# Patient Record
Sex: Female | Born: 1945 | Race: White | Hispanic: No | Marital: Married | Smoking: Never smoker
Health system: Southern US, Community
[De-identification: ages and names within clinical notes are randomized; demographics above are authoritative.]

## PROBLEM LIST (undated history)

## (undated) DIAGNOSIS — R7301 Impaired fasting glucose: Secondary | ICD-10-CM

## (undated) DIAGNOSIS — K649 Unspecified hemorrhoids: Secondary | ICD-10-CM

## (undated) DIAGNOSIS — R002 Palpitations: Secondary | ICD-10-CM

## (undated) DIAGNOSIS — R319 Hematuria, unspecified: Secondary | ICD-10-CM

## (undated) DIAGNOSIS — E119 Type 2 diabetes mellitus without complications: Secondary | ICD-10-CM

## (undated) DIAGNOSIS — E669 Obesity, unspecified: Secondary | ICD-10-CM

## (undated) DIAGNOSIS — E042 Nontoxic multinodular goiter: Secondary | ICD-10-CM

## (undated) DIAGNOSIS — E782 Mixed hyperlipidemia: Secondary | ICD-10-CM

## (undated) DIAGNOSIS — K219 Gastro-esophageal reflux disease without esophagitis: Secondary | ICD-10-CM

## (undated) DIAGNOSIS — F419 Anxiety disorder, unspecified: Secondary | ICD-10-CM

## (undated) DIAGNOSIS — M8580 Other specified disorders of bone density and structure, unspecified site: Secondary | ICD-10-CM

## (undated) DIAGNOSIS — I10 Essential (primary) hypertension: Secondary | ICD-10-CM

## (undated) DIAGNOSIS — E559 Vitamin D deficiency, unspecified: Secondary | ICD-10-CM

## (undated) HISTORY — DX: Nontoxic multinodular goiter: E04.2

## (undated) HISTORY — DX: Vitamin D deficiency, unspecified: E55.9

## (undated) HISTORY — DX: Hematuria, unspecified: R31.9

## (undated) HISTORY — DX: Impaired fasting glucose: R73.01

## (undated) HISTORY — DX: Obesity, unspecified: E66.9

## (undated) HISTORY — DX: Other specified disorders of bone density and structure, unspecified site: M85.80

## (undated) HISTORY — DX: Gastro-esophageal reflux disease without esophagitis: K21.9

## (undated) HISTORY — DX: Essential (primary) hypertension: I10

## (undated) HISTORY — DX: Type 2 diabetes mellitus without complications: E11.9

## (undated) HISTORY — DX: Palpitations: R00.2

## (undated) HISTORY — DX: Mixed hyperlipidemia: E78.2

## (undated) HISTORY — DX: Anxiety disorder, unspecified: F41.9

## (undated) HISTORY — DX: Unspecified hemorrhoids: K64.9

---

## 1999-05-29 ENCOUNTER — Encounter: Admission: RE | Admit: 1999-05-29 | Discharge: 1999-05-29 | Payer: Self-pay | Admitting: Family Medicine

## 1999-05-29 ENCOUNTER — Encounter: Payer: Self-pay | Admitting: Family Medicine

## 2000-05-29 ENCOUNTER — Encounter: Payer: Self-pay | Admitting: Family Medicine

## 2000-05-29 ENCOUNTER — Encounter: Admission: RE | Admit: 2000-05-29 | Discharge: 2000-05-29 | Payer: Self-pay | Admitting: Family Medicine

## 2001-02-11 ENCOUNTER — Encounter: Payer: Self-pay | Admitting: Emergency Medicine

## 2001-02-11 ENCOUNTER — Inpatient Hospital Stay (HOSPITAL_COMMUNITY): Admission: EM | Admit: 2001-02-11 | Discharge: 2001-02-11 | Payer: Self-pay | Admitting: Emergency Medicine

## 2001-05-31 ENCOUNTER — Encounter: Payer: Self-pay | Admitting: *Deleted

## 2001-05-31 ENCOUNTER — Encounter: Admission: RE | Admit: 2001-05-31 | Discharge: 2001-05-31 | Payer: Self-pay | Admitting: *Deleted

## 2001-12-17 ENCOUNTER — Ambulatory Visit (HOSPITAL_COMMUNITY): Admission: RE | Admit: 2001-12-17 | Discharge: 2001-12-17 | Payer: Self-pay | Admitting: *Deleted

## 2002-06-08 ENCOUNTER — Encounter: Admission: RE | Admit: 2002-06-08 | Discharge: 2002-09-06 | Payer: Self-pay | Admitting: Family Medicine

## 2002-07-05 ENCOUNTER — Encounter: Payer: Self-pay | Admitting: Family Medicine

## 2002-07-05 ENCOUNTER — Encounter: Admission: RE | Admit: 2002-07-05 | Discharge: 2002-07-05 | Payer: Self-pay | Admitting: Family Medicine

## 2003-07-20 ENCOUNTER — Encounter: Admission: RE | Admit: 2003-07-20 | Discharge: 2003-09-05 | Payer: Self-pay | Admitting: Family Medicine

## 2003-10-23 ENCOUNTER — Ambulatory Visit (HOSPITAL_COMMUNITY): Admission: RE | Admit: 2003-10-23 | Discharge: 2003-10-23 | Payer: Self-pay | Admitting: Family Medicine

## 2003-10-31 ENCOUNTER — Other Ambulatory Visit: Admission: RE | Admit: 2003-10-31 | Discharge: 2003-10-31 | Payer: Self-pay | Admitting: *Deleted

## 2003-11-13 ENCOUNTER — Encounter: Admission: RE | Admit: 2003-11-13 | Discharge: 2003-11-13 | Payer: Self-pay | Admitting: Family Medicine

## 2003-12-28 ENCOUNTER — Ambulatory Visit (HOSPITAL_COMMUNITY): Admission: RE | Admit: 2003-12-28 | Discharge: 2003-12-28 | Payer: Self-pay | Admitting: General Surgery

## 2003-12-28 ENCOUNTER — Encounter (INDEPENDENT_AMBULATORY_CARE_PROVIDER_SITE_OTHER): Payer: Self-pay | Admitting: Specialist

## 2004-07-08 ENCOUNTER — Ambulatory Visit (HOSPITAL_COMMUNITY): Admission: RE | Admit: 2004-07-08 | Discharge: 2004-07-08 | Payer: Self-pay | Admitting: General Surgery

## 2004-12-30 ENCOUNTER — Ambulatory Visit (HOSPITAL_COMMUNITY): Admission: RE | Admit: 2004-12-30 | Discharge: 2004-12-30 | Payer: Self-pay | Admitting: Family Medicine

## 2005-11-24 ENCOUNTER — Encounter: Admission: RE | Admit: 2005-11-24 | Discharge: 2005-11-24 | Payer: Self-pay | Admitting: Family Medicine

## 2006-02-11 ENCOUNTER — Other Ambulatory Visit: Admission: RE | Admit: 2006-02-11 | Discharge: 2006-02-11 | Payer: Self-pay | Admitting: Surgery

## 2006-03-11 ENCOUNTER — Ambulatory Visit (HOSPITAL_COMMUNITY): Admission: RE | Admit: 2006-03-11 | Discharge: 2006-03-11 | Payer: Self-pay | Admitting: Family Medicine

## 2006-12-30 ENCOUNTER — Other Ambulatory Visit: Admission: RE | Admit: 2006-12-30 | Discharge: 2006-12-30 | Payer: Self-pay | Admitting: Family Medicine

## 2007-05-17 ENCOUNTER — Encounter: Admission: RE | Admit: 2007-05-17 | Discharge: 2007-05-17 | Payer: Self-pay | Admitting: General Surgery

## 2007-05-25 ENCOUNTER — Ambulatory Visit (HOSPITAL_COMMUNITY): Admission: RE | Admit: 2007-05-25 | Discharge: 2007-05-25 | Payer: Self-pay | Admitting: *Deleted

## 2008-05-22 ENCOUNTER — Encounter: Admission: RE | Admit: 2008-05-22 | Discharge: 2008-05-22 | Payer: Self-pay | Admitting: General Surgery

## 2008-05-26 ENCOUNTER — Ambulatory Visit (HOSPITAL_COMMUNITY): Admission: RE | Admit: 2008-05-26 | Discharge: 2008-05-26 | Payer: Self-pay | Admitting: Family Medicine

## 2008-09-19 ENCOUNTER — Ambulatory Visit (HOSPITAL_BASED_OUTPATIENT_CLINIC_OR_DEPARTMENT_OTHER): Admission: RE | Admit: 2008-09-19 | Discharge: 2008-09-19 | Payer: Self-pay | Admitting: Orthopedic Surgery

## 2009-07-31 ENCOUNTER — Ambulatory Visit (HOSPITAL_BASED_OUTPATIENT_CLINIC_OR_DEPARTMENT_OTHER): Admission: RE | Admit: 2009-07-31 | Discharge: 2009-07-31 | Payer: Self-pay | Admitting: Surgery

## 2010-04-16 IMAGING — MG MM DIGITAL SCREENING BILAT
4 series · 4 of 4 positions shown · non-contrast
Comparison: none

DG SCREEN MAMMOGRAM BILATERAL
Bilateral CC and MLO view(s) were taken.

DIGITAL SCREENING MAMMOGRAM WITH CAD:
The breast tissue is almost entirely fatty.  No masses or malignant type calcifications are 
identified.  Compared with prior studies.

[R CC]
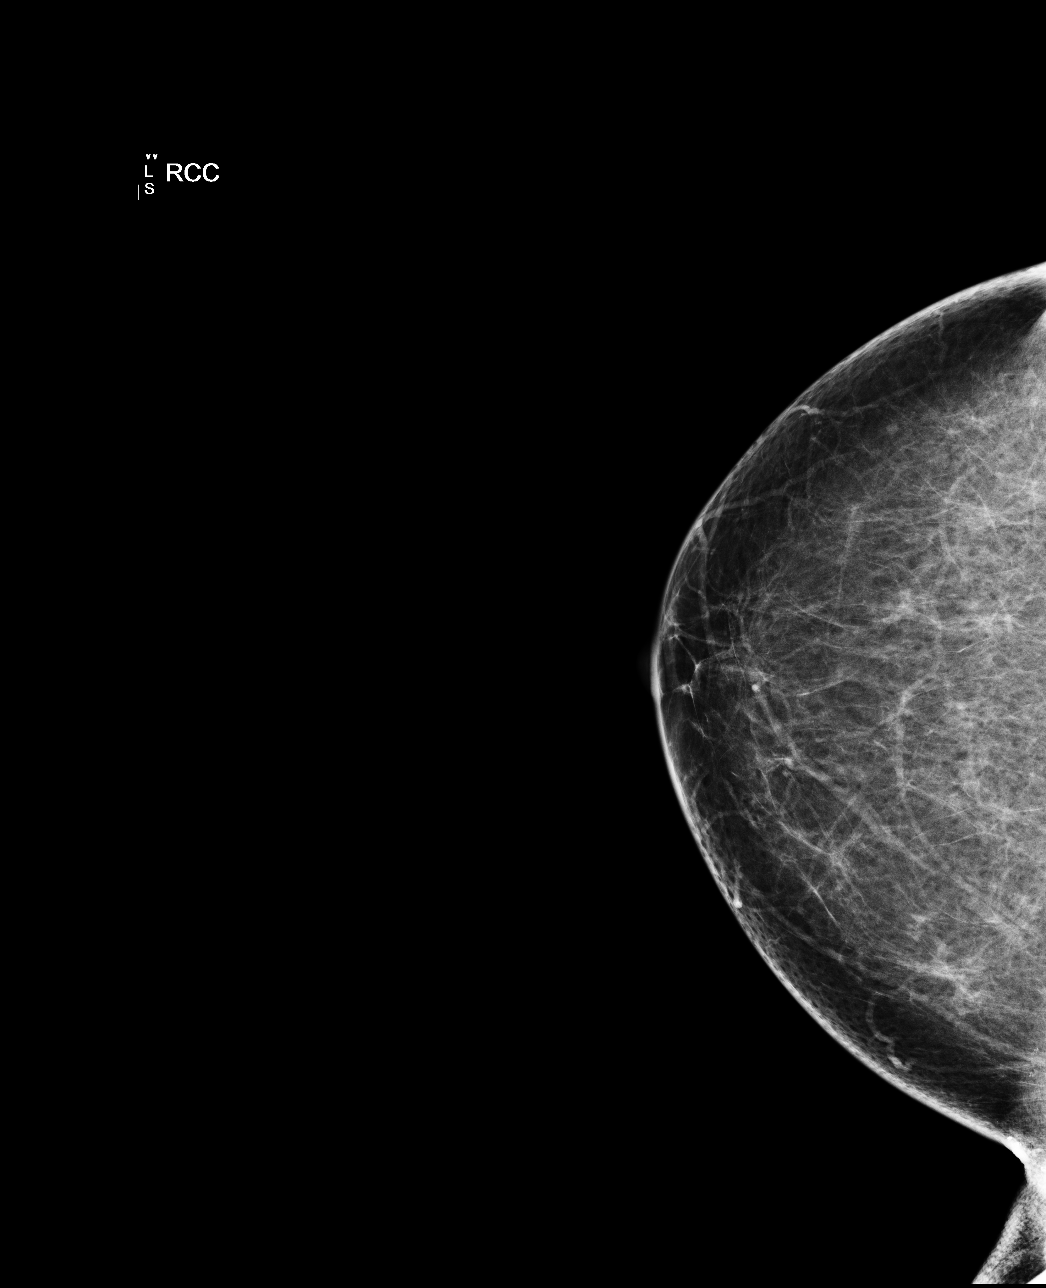

[R MLO]
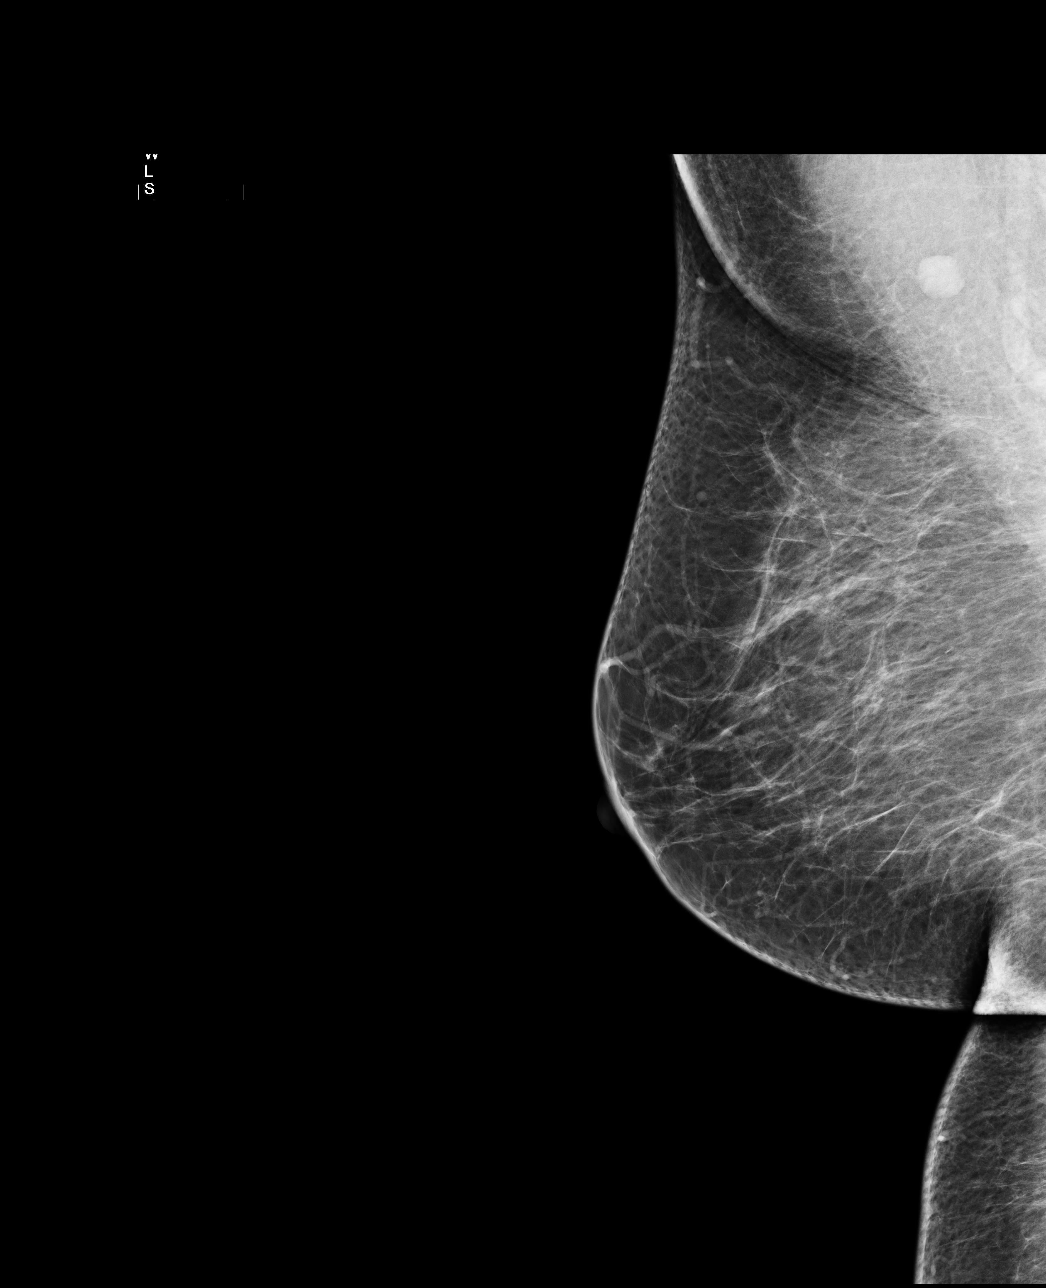

[L CC]
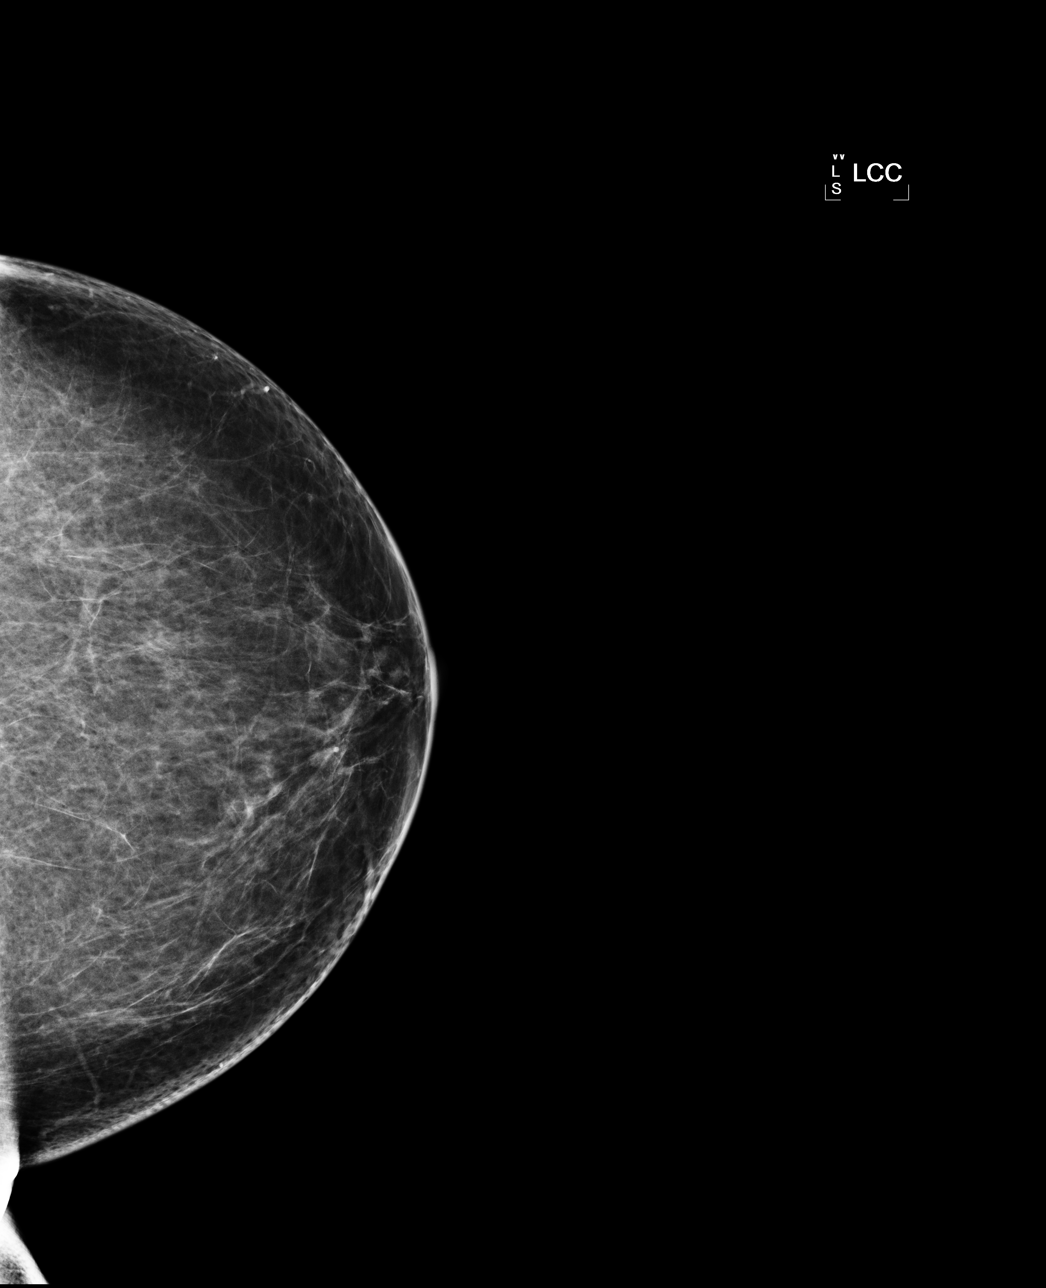

[L MLO]
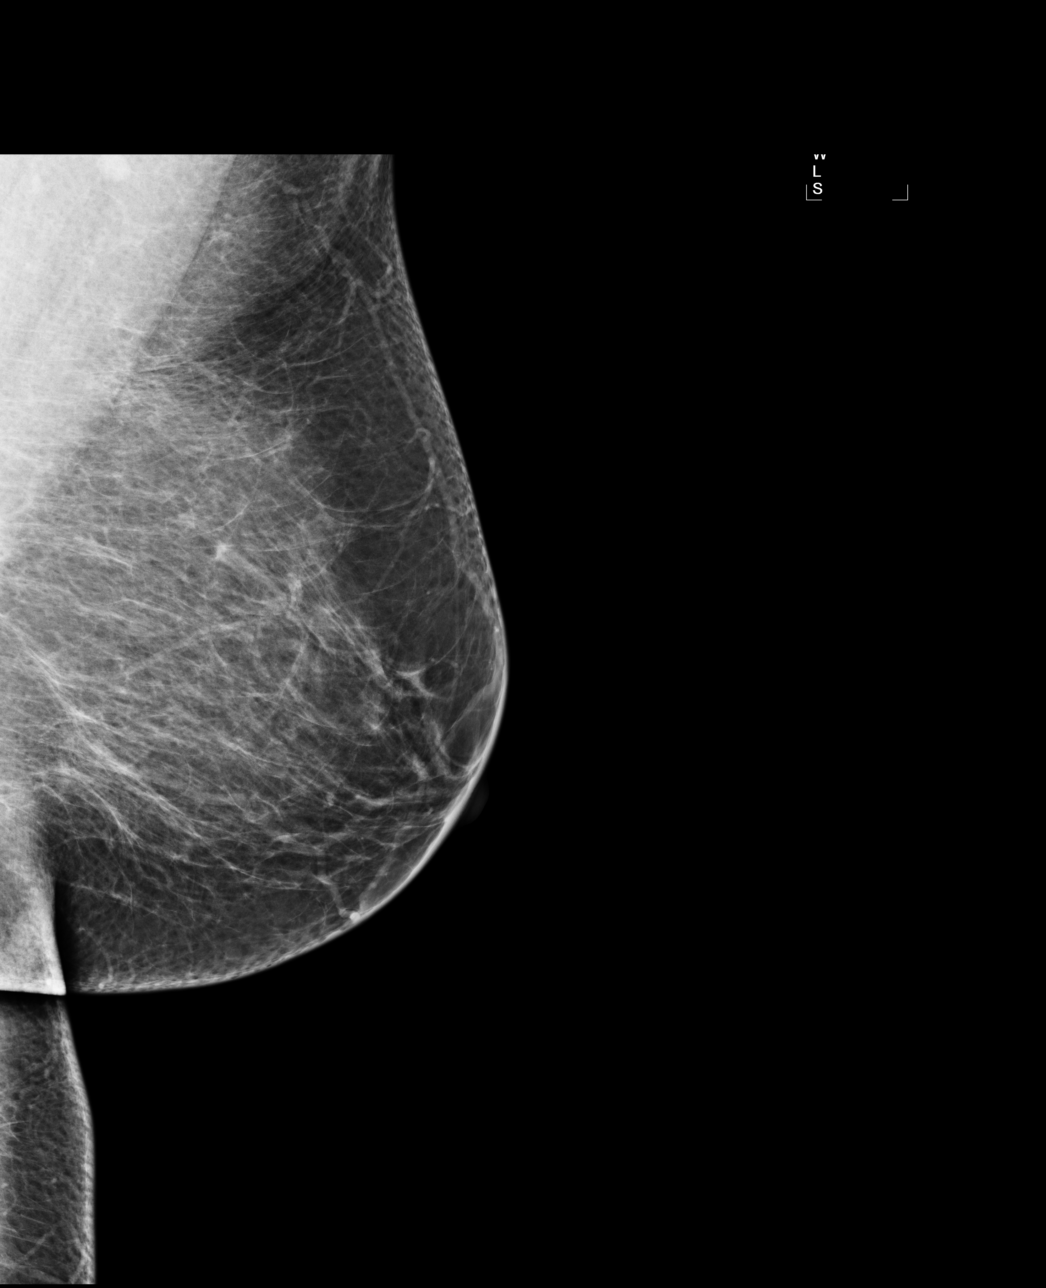

[4 of 4 positions shown; findings below may reference images not displayed]

IMPRESSION: No specific mammographic evidence of malignancy.  Next screening mammogram is recommended in one 
year.

A result letter of this screening mammogram will be mailed directly to the patient.

ASSESSMENT: Negative - BI-RADS 1

Screening mammogram in 1 year.
ANALYZED BY COMPUTER AIDED DETECTION. , THIS PROCEDURE WAS A DIGITAL MAMMOGRAM.

## 2010-05-04 LAB — BASIC METABOLIC PANEL
CO2: 29 mEq/L (ref 19–32)
Calcium: 9.1 mg/dL (ref 8.4–10.5)
GFR calc Af Amer: >60 mL/min (ref >60)
GFR calc non Af Amer: >60 mL/min (ref >60)
Glucose, Bld: 95 mg/dL (ref 70–99)
Potassium: 4.1 mEq/L (ref 3.5–5.1)
Sodium: 138 mEq/L (ref 135–145)

## 2010-05-04 LAB — POCT HEMOGLOBIN-HEMACUE: Hemoglobin: 13.2 g/dL (ref 12.0–15.0)

## 2010-05-04 LAB — GLUCOSE, CAPILLARY: Glucose-Capillary: 138 mg/dL — ABNORMAL HIGH (ref 70–99)

## 2010-06-11 NOTE — Op Note (Signed)
[name], [name]                ACCOUNT NO.:  [id number]   MEDICAL RECORD NO.:  [id number]          PATIENT TYPE:  AMB   LOCATION:  DSC                          FACILITY:  [facility]   PHYSICIAN:  Cindee Salt, M.D.       DATE OF BIRTH:  [birth year]   DATE OF PROCEDURE:  09/19/2008  DATE OF DISCHARGE:                               OPERATIVE REPORT   PREOPERATIVE DIAGNOSIS:  Distal radius fracture, left wrist.   POSTOPERATIVE DIAGNOSIS:  Distal radius fracture, left wrist.   OPERATION:  Open reduction and internal fixation with allograft, left  distal radius, DVR plate, small left.   SURGEON:  Cindee Salt, MD   ASSISTANT:  Dr. Rodman Pickle.   ANESTHESIA:  General.   ANESTHESIOLOGIST:  Bedelia Person, MD   HISTORY:  The patient is a 65 year old female who suffered a fall with a  fracture of her distal radius just beneath the articular surface.  She  is admitted for possible volar plate fixation, possible bridge graft  with bone graft, left distal radius.  She is aware of risks and  complications including infection, recurrence, injury to arteries,  nerves, tendons, complete relief of symptoms, dystrophy, nonunion.  Preoperative area of the patient is seen.  The extremity marked by both  the patient and surgeon.  Antibiotic given.   PROCEDURE:  The patient was brought to the operating room where a  general anesthetic was carried out without difficulty.  She was prepped  using ChloraPrep, supine position, left arm free.  Time-out taken  confirming the patient and procedure.  A 3-minute dry time allowed.  She  was then draped.  A manipulation was performed of the fracture.  This  allowed some reduction but not complete.  It was decided to proceed with  a volar plating and bone grafting.  Attempted fixation directly beneath  the articular subchondral surface.  The limb was exsanguinated with an  Esmarch bandage.  Tourniquet placed high and the arm was inflated to 250  mmHg.  A volar incision  was made to be extended, carried down through  subcutaneous tissue.  The radial artery was identified and protected.  The interval between the radial artery flexor carpi radialis was opened.  Dissection carried down to the flexor pollicis longus pronator  quadratus.  Pronator quadratus was incised in its radial aspect,  elevated, the brachial radialis was released.  The first dorsal  compartment partially released.  This allowed access with opening of the  fracture site.  This was found be in the watershed area of the distal  radius.  The dorsal cortex was then isolated.  The keeled periosteum was  partially incised.  This allowed further reduction.  A Freer elevator  was passed into the fracture.  This was then used to elevate.  X-rays  confirmed repositioning of the fracture.  A 5 mL bone graft was then  placed.  This was left as thick tubes maintaining the reduction.  This  was confirmed with OEC image intensification both AP and lateral  direction.  A DVR plate was then selected.  This  was a narrow short  plate.  This was placed, pinned in position with 4 or 5 K-wires  including two pins going down the ulnar, two pins to be certain that  they did not enter the joint where future screws or pegs would be  placed.  This confirmed positioning of the plate.  The guide sliding  hole was then placed.  I drilled a 12-mm screw placed firmly fixing the  plate.  The first screw was then placed.  This was found to lie  subchondrally on the ulnar surface not entering the joint.  This  measured 20 mm.  This was a nonlocking threaded screw.  The remaining  screws and pegs were placed.  These were placed as pegs on the ulnar  side and screws into the radial styloid.  These measured between 18 and  22 mm.  X-rays were taken revealing that the most ulnar distal screw may  have penetrated into the joint although was felt with the drilling and  measuring for the depth gauge that it was entirely within  bone.  X-rays  were taken multiple directions revealing that it was unable to be  ascertained that it was not partially within the joint.  As such, this  peg was removed, the remaining proximal screws were placed.  These each  measured 10 or 12 mm.  This firmly fixed the fracture and plate.  She  had full pronation, supination, flexion/extension, radial and ulnar  deviation of the wrist.  Further bone graft was placed into the fracture  site.  Wound was copiously irrigated with saline.  The pronator  quadratus was repaired over the plate with figure-of-eight 4-0 Vicryl  sutures, the subcutaneous tissue with interrupted 4-0 Vicryl, and the  skin with interrupted 4-0 Vicryl Rapide.  A sterile compressive dressing  and dorsal palmar splint were applied.  On deflation of the tourniquet,  all fingers immediately pinked.  Bleeders were electrocauterized  throughout the procedure with bipolar.  The patient tolerated the  procedure well and was taken to the recovery room for observation in  satisfactory condition.  She will be discharged home to return to the  hand center in [place] in 1 week on Percocet.           ______________________________  Cindee Salt, M.D.     GK/MEDQ  D:  09/19/2008  T:  09/20/2008  Job:  [id number]   cc:   Lunette Stands, M.D.

## 2010-06-14 NOTE — Consult Note (Signed)
[place]. [facility]  Patient:    [name], [name] Visit Number: [id number] MRN: [id number]          Service Type: MED Location: [phone] Attending Physician:  Miguel Aschoff Dictated by:   Darci Needle, M.D. Proc. Date: 02/11/01 Admit Date:  02/11/2001 Discharge Date: 02/11/2001   CC:         Francis Dowse H. Idell Pickles, M.D., primary care physician  Rosanne Sack, M.D.   Consultation Report  CONCLUSIONS: 1. Prolonged chest pressure consistent with ischemic quality chest pain.    Nonspecific T wave abnormality involving leads I, aVL, V1 and V2. 2. Syncope during the patients prolonged chest pain of uncertain etiology.    Rule out vagal, rule out ischemic arrhythmia, rule out orthostasis. 3. Hypercholesterolemia on Lipitor.  RECOMMENDATION: 1. Serial enzymes and monitoring to rule out myocardial injury and arrhythmia. 2. Coronary angiography to exclude coronary artery disease. 3. If coronary angiography negative, consider carotid Doppler and    echocardiogram. 4. Continue aspirin and statin. 5. Hold antithrombotic therapy pending catheterization later this morning.  COMMENTS:  The patient is 65 years of age and has been in good health.  She has started to exercise over the past two weeks.  She has felt fine while walking on her treadmill.  When she went to bed last night she felt a little bit tired and weak but had no specific complaints.  She awakened at approximately 12:15 a.m. with substernal pressure in her chest.  After several minutes she got up to go to the bathroom, and after having gone to the bathroom and urinated, on her way back she collapsed.  Her husband heard her fall and found her on the floor, where she was pale and diaphoretic.  He dragged her back to the bed and at that point she began to come around.  There was never any mention of absent respiratory effort.  Initially she was somewhat confused but this cleared up  after 30 to 60 seconds.  She continued to have mild to moderate substernal tightness that lasted 35 to 40 minutes after the episode.  She estimates that the total duration of chest discomfort was 45 minutes.  In the emergency room she had total resolution of the discomfort by the time of evaluation and has had no subsequent discomfort. There was no radiation of the discomfort or shortness of breath.  There is one prior history of an episode of syncope over 10 years ago when she received bad news concerning her fathers health.  FAMILY HISTORY:  Coronary artery disease.  Her father had an MI at age 43.  SOCIAL HISTORY:  She does not smoke.  MEDICAL HISTORY:  She has a history of hypercholesterolemia and is on Lipitor. She is not diabetic.  There is no history of treated hypertension.  SIGNIFICANT MEDICAL PROBLEMS: 1. Hyperlipidemia. 2. Gastroesophageal reflux disease. 3. Sinusitis. 4. History of diet-controlled diabetes.  PHYSICAL EXAMINATION:  GENERAL:  The patient is in no acute distress.  VITAL SIGNS:  Blood pressure 142/70, heart rate 70.  SKIN:  Warm and dry.  HEENT:  Unremarkable, no xanthelasma.  NECK:  No carotid bruits, no JVD.  LUNGS:  Clear to auscultation and percussion.  CARDIAC:  Unremarkable.  No murmur, click, rub, or gallop is heard.  PMI is nonpalpable.  ABDOMEN:  Soft.  Liver and spleen are not palpable.  EXTREMITIES:  No edema.  NEUROLOGIC:  Patient is intact without focal cranial nerve or motor  deficits.  LABORATORY DATA:  EKG initially demonstrated T wave inversion in I, aVL, V1, and V2.  Subsequent EKG revealed less T wave abnormality 30 minutes later.  Initial CK enzymes and other laboratory work were unremarkable. Dictated by:   Darci Needle, M.D. Attending Physician:  Miguel Aschoff DD:  02/11/01 TD:  02/12/01 Job: [id number]

## 2010-06-14 NOTE — H&P (Signed)
[place]. [facility]  Patient:    Hannah, Wiley Visit Number: [id number] MRN: [id number]          Service Type: Attending:  Rosanne Sack, M.D. Dictated by:   Rosanne Sack, M.D. Adm. Date:  02/11/01   CC:         [name], M.D., [facility]  Darci Needle, M.D., [facility]   History and Physical  DATE OF BIRTH:  12-08-1945  PROBLEM LIST:  1. Chest pain, rule out myocardial infarction.  2. Syncope.  3. Abnormal electrocardiogram (slight ST elevation in leads III and aVF,     Q wave in lead III, T wave inversion in leads I, aVL, V1, V2, and early     R wave progression in precordial leads).  4. Hypertension, diet controlled.  5. Hyperlipidemia.  6. Prerenal azotemia, BUN 10, creatinine 1.0.  7. Gastroesophageal reflux disease.  8. Postmenopausal, quit hormonal replacement therapy in the recent past after     recent studies in the media.  9. Recurrent sinusitis and sinus congestion associated with occasional     headaches. 10. Status post hysterectomy with a single oophorectomy in 1974.  CHIEF COMPLAINT:  Chest pain.  HISTORY OF PRESENT ILLNESS:  [name] is a very pleasant 65 year old female who presents with complaints of substernal chest tightness since midnight today.  The patient felt not right last evening.  She walks on her treadmill daily.  After walking on the treadmill last evening, she felt weak and "feeling not right."  She woke up this morning with substernal chest tightness associated with nausea.  No vomiting.  No radiation.  No diaphoresis.  This chest tightness did not get any better sitting up.  She describes this chest tightness as a completely different feeling than her occasional GERD symptoms. She went to the bathroom.  On her way back from the bathroom and while standing up, the patient felt light-headed and nauseated.  She passed out for several minutes.  There  was no evidence of seizure activity.  Her husband was present in the house.  No bowel or urinary incontinence.  No chest pain.  No lower extremity swelling.  No recent long trips.  No recent trauma.  No surgery.  No recent fever, chills, nausea, vomiting, diarrhea or constipation. The patient felt "bad" and still light-headedness with chest pressure when she woke up.  Initially, she could not talk clearly for a few seconds, though afterwards, she was able to talk normally.  No focal weakness.  No palpitations prior to the syncopal episode.  The patient denies new medications.  No previous cardiac problems.  When the EMS arrived, the systolic blood pressure dropped from 137 to 120 from sitting up to standing up.  No swallowing problems.  No amaurosis fugax.  No focal weakness.  The patient had been noticing some increased sinus congestion for which she took her usual over-the-counter antihistaminic.  Occasional GERD symptoms.  No urinary symptoms.  No melena, tarry stools or bright red blood per rectum.  No weight loss.  PAST MEDICAL HISTORY:  As HPI.  ALLERGIES:  None.  MEDICATIONS:  1. Lipitor 20 mg p.o. q.d.  2. Over-the-counter H2 blocker p.r.n.  3. Sine-Off p.r.n.  4. Calcium with vitamin D.  SOCIAL HISTORY:  The patient is married and has three grown children.  She never smoked or drank.  She works in [employer].  FAMILY MEDICAL HISTORY:  The patients father  had an acute MI at age of 70. No diabetes in the family nor strokes or malignancies.  Her mother and father had hypertension.  REVIEW OF SYSTEMS:  As HPI.  PHYSICAL EXAMINATION:  VITAL SIGNS:  Temperature 97.0, blood pressure 142/56, heart rate 94, respiration rate 20, oxygen saturation 96% on room air.  HEENT:  Normocephalic, atraumatic.  Nonicteric sclerae, conjunctivae within the normal limits.  PERRLA, EOMI.  Funduscopic exam negative for papilledema or hemorrhages.  Slight dry mucous  membranes.  TMs within the normal limits. Oropharynx clear.  NECK:  Supple.  No JVD, no bruits, no adenopathy, no thyromegaly.  LUNGS:  Clear to auscultation bilaterally without crackles, wheezes.  Fair air movement bilaterally.  CARDIAC:  A regular rate and rhythm without murmurs, rubs, gallops.  Normal S1 and S2.  ABDOMEN:  Slightly obese, nontender, nondistended.  Bowel sounds were present. No hepatosplenomegaly.  No rebound, no guarding, no masses, no bruits.  BREASTS:  Within the normal limits.  GU:  Within the normal limits.  RECTAL:  Empty vault, normal sphincter tone.  No masses.  LOWER EXTREMITIES:  No edema, clubbing or cyanosis.  Pulses 2+ bilaterally.  NEUROLOGIC:  Alert and oriented x3.  Cranial nerves II-XII intact.  Strength 5/5 in all extremities.  DTRs 3/5 in all extremities.  Sensory intact. Plantar reflexes downgoing bilaterally.  LABORATORY AND ACCESSORY DATA:  EKG consistent with normal sinus rhythm with a questionable slight 1-mm ST segment elevation in lead III and aVF, questionable Q wave in lead III, T wave inversion in leads I and aVL along with V1 and V2.  Early R wave progression.  CK 62, CK-MB 0.3, troponin I less than 0.01.  Sodium 137, potassium 3.8, chloride 103, CO2 27, glucose 116, BUN 20, creatinine 1.7.  Hemoglobin 13.8, MCV 86, WBC 11.3, absolute neutrophil count 10.1, platelets 270,000.  Chest x-ray negative.  ASSESSMENT AND PLAN:  1. Chest pain, rule out myocardial infarction, associated with syncope -- the     differential diagnosis includes cardiac source including acute myocardial     infarction, aortic dissection, aortic valve stenosis ______ .  Also,     other cardiac etiologies like a cardiac arrhythmia need to be considered.     At this point, I did not hear any heart murmurs nor abnormal heart sounds     consistent with ______.  The electrocardiogram has been sinus rhythm      since the patient arrived to the emergency room.   Also, other etiologies     like vasovagal, orthostatic hypotension need to be considered.  At this     point, I do not think that the patient had a stroke, since there is no     evidence of neurological deficits.  A transient ischemic attack still     could be considered.  Also, other etiologies like pulmonary emboli need to     be considered, though there is no evidence of shortness of breath or     hypoxia.  There are no new medications; the patient was taking     antihistaminics over the counter.      Plan:  The patient will be admitted to a telemetry bed.  A     2-D echocardiogram will be obtained along with cardiac enzymes.     Orthostatic blood pressures will be repeated every morning.  The patient     may need chest CT scan with intravenous contrast for further evaluation.     A cardiology consult  has been already obtained.  Dr. Darci Needle     will see the patient.  We will provide with supportive care with     intravenous fluids.  Will use aspirin, nitroglycerin paste and beta     blocker for now for cardiac protection.   2. Abnormal electrocardiogram -- there is no previous electrocardiogram to     compare with.  The abnormal electrocardiogram could reflect significant     coronary artery disease.  The plan is described in problem #1.   3. Hypertension -- the patients blood pressure is stable.  For now, we will     follow up the patients blood pressure on a beta blocker and nitrates.   4. Prerenal azotemia -- the BUN and creatinine ratio is slightly increased.     There is no evidence of a source for dehydration, though the mucous     membranes are dry.  The patient denies urinary symptoms or diarrhea or     fever.  Her appetite has been stable.  We will continue with intravenous     fluids and check orthostatic blood pressure long with fluid balance daily.   5. Gastroesophageal reflux disease.  Will start a proton pump inhibitor for     now for gastrointestinal  protection.   6. Dyslipidemia -- for now, we will continue Lipitor and liver function tests     will be obtained in the morning. Dictated by:   Rosanne Sack, M.D. Attending:  Rosanne Sack, M.D. DD:  02/11/01 TD:  02/11/01 Job: [id number]

## 2010-06-14 NOTE — Cardiovascular Report (Signed)
[place]. [facility]  Patient:    [name], KLIMAS Visit Number: [id number] MRN: [id number]          Service Type: MED Location: [phone] Attending Physician:  Miguel Aschoff Dictated by:   Darci Needle, M.D. Proc. Date: 02/11/01 Admit Date:  02/11/2001   CC:         Viviana Simpler, M.D.  Rosanne Sack, M.D.  Dellis Anes Idell Pickles, M.D.   Cardiac Catheterization  INDICATIONS:  Prolonged ischemic-quality chest discomfort associated with syncope.  PROCEDURES PERFORMED: 1. Left heart catheterization. 2. Selective coronary angiography. 3. Left ventriculography.  DESCRIPTION OF PROCEDURE:  After informed consent, a #6 French sheath was inserted into the right femoral artery using the modified Seldinger technique. A #6 French A2 Multipurpose catheter was used for hemodynamic recordings, left ventriculography by hand injection, and selective right coronary angiography. A #4 left Judkins catheter was used for left coronary angiography.  The patient tolerated the procedure without complications.  RESULTS:   I. Hemodynamic data      A. Aortic pressure 145/86.      B. Left ventricular pressure 145/8 mmHg.  II. Left ventriculography:  The left ventricle was hyperdynamic.  EF greater      than 60%.  No MR. III. Selective coronary angiography      A. Left main coronary:  Normal.      B. Left anterior descending coronary:  Large.  It gives origin to three         diagonal branches.  There is one dominant diagonal which is the second         diagonal.  This is also normal.  The entire LAD system is         unremarkable.      C. Circumflex artery:  Gives origin to three obtuse marginal branches.         The entire circumflex system is normal.      D. Right coronary:  The right coronary is large, dominant, and free of         any obstruction.  It gives a PDA and three small left ventricular         branches.  CONCLUSIONS: 1. Normal  coronary arteries. 2. Normal left ventricular function. 3. Prolonged ischemic-quality chest discomfort, etiology uncertain, but not    felt to be secondary to ischemic coronary disease.  Probably not secondary    to coronary artery spasm.  Rule out gastrointestinal etiology. 4. Probable hypertension. 5. Syncope related to chest discomfort.  Rule out tachy arrhythmia causing    chest discomfort.  Rule out neurally mediated syncope secondary to chest    discomfort.  Rule out orthostasis/vagal causes.  PLAN: 1. Monitor x 12-24 hours. 2. Ambulate. 3. Continue work-up with echocardiogram to rule out pulmonary hypertension,    pericardial disease, and aortic dissection (doubt). 4. Consider low dose beta blocker therapy for hypertension and also to block    reflex recurrent syncope. Dictated by:   Darci Needle, M.D. Attending Physician:  Miguel Aschoff DD:  02/11/01 TD:  02/11/01 Job: [id number]

## 2010-08-22 ENCOUNTER — Ambulatory Visit: Payer: Self-pay | Admitting: Physical Therapy

## 2011-08-18 ENCOUNTER — Other Ambulatory Visit (HOSPITAL_COMMUNITY): Payer: Self-pay | Admitting: Family Medicine

## 2011-08-18 DIAGNOSIS — Z1231 Encounter for screening mammogram for malignant neoplasm of breast: Secondary | ICD-10-CM

## 2011-09-08 ENCOUNTER — Ambulatory Visit (HOSPITAL_COMMUNITY)
Admission: RE | Admit: 2011-09-08 | Discharge: 2011-09-08 | Disposition: A | Discharge status: Home / Self Care | Payer: Medicare Other | Source: Ambulatory Visit | Attending: Family Medicine | Admitting: Family Medicine

## 2011-09-08 DIAGNOSIS — Z1231 Encounter for screening mammogram for malignant neoplasm of breast: Secondary | ICD-10-CM | POA: Insufficient documentation

## 2012-10-18 ENCOUNTER — Other Ambulatory Visit (HOSPITAL_COMMUNITY): Payer: Self-pay | Admitting: Family Medicine

## 2012-10-18 DIAGNOSIS — Z1231 Encounter for screening mammogram for malignant neoplasm of breast: Secondary | ICD-10-CM

## 2012-10-21 ENCOUNTER — Ambulatory Visit (HOSPITAL_COMMUNITY)
Admission: RE | Admit: 2012-10-21 | Discharge: 2012-10-21 | Disposition: A | Discharge status: Home / Self Care | Payer: Medicare Other | Source: Ambulatory Visit | Attending: Family Medicine | Admitting: Family Medicine

## 2012-10-21 DIAGNOSIS — Z1231 Encounter for screening mammogram for malignant neoplasm of breast: Secondary | ICD-10-CM | POA: Insufficient documentation

## 2014-03-27 ENCOUNTER — Other Ambulatory Visit (HOSPITAL_COMMUNITY): Payer: Self-pay | Admitting: Family Medicine

## 2014-03-27 DIAGNOSIS — Z1231 Encounter for screening mammogram for malignant neoplasm of breast: Secondary | ICD-10-CM

## 2014-03-28 ENCOUNTER — Ambulatory Visit (HOSPITAL_COMMUNITY)
Admission: RE | Admit: 2014-03-28 | Discharge: 2014-03-28 | Disposition: A | Discharge status: Home / Self Care | Payer: Medicare Other | Source: Ambulatory Visit | Attending: Family Medicine | Admitting: Family Medicine

## 2014-03-28 DIAGNOSIS — Z1231 Encounter for screening mammogram for malignant neoplasm of breast: Secondary | ICD-10-CM | POA: Diagnosis not present

## 2015-05-01 ENCOUNTER — Other Ambulatory Visit: Payer: Self-pay

## 2015-05-01 DIAGNOSIS — Z1231 Encounter for screening mammogram for malignant neoplasm of breast: Secondary | ICD-10-CM

## 2015-05-17 ENCOUNTER — Ambulatory Visit
Admission: RE | Admit: 2015-05-17 | Discharge: 2015-05-17 | Disposition: A | Discharge status: Home / Self Care | Payer: Medicare Other | Source: Ambulatory Visit

## 2015-05-17 DIAGNOSIS — Z1231 Encounter for screening mammogram for malignant neoplasm of breast: Secondary | ICD-10-CM

## 2016-08-11 ENCOUNTER — Other Ambulatory Visit: Payer: Self-pay | Admitting: Family Medicine

## 2016-08-11 DIAGNOSIS — Z1231 Encounter for screening mammogram for malignant neoplasm of breast: Secondary | ICD-10-CM

## 2016-08-22 ENCOUNTER — Ambulatory Visit
Admission: RE | Admit: 2016-08-22 | Discharge: 2016-08-22 | Disposition: A | Discharge status: Home / Self Care | Payer: Medicare Other | Source: Ambulatory Visit | Attending: Family Medicine | Admitting: Family Medicine

## 2016-08-22 DIAGNOSIS — Z1231 Encounter for screening mammogram for malignant neoplasm of breast: Secondary | ICD-10-CM

## 2017-10-13 ENCOUNTER — Other Ambulatory Visit: Payer: Self-pay | Admitting: Family Medicine

## 2017-10-13 DIAGNOSIS — Z1231 Encounter for screening mammogram for malignant neoplasm of breast: Secondary | ICD-10-CM

## 2017-11-16 ENCOUNTER — Ambulatory Visit
Admission: RE | Admit: 2017-11-16 | Discharge: 2017-11-16 | Disposition: A | Discharge status: Home / Self Care | Payer: Medicare Other | Source: Ambulatory Visit | Attending: Family Medicine | Admitting: Family Medicine

## 2017-11-16 DIAGNOSIS — Z1231 Encounter for screening mammogram for malignant neoplasm of breast: Secondary | ICD-10-CM

## 2018-05-20 ENCOUNTER — Other Ambulatory Visit: Payer: Self-pay | Admitting: Family Medicine

## 2018-05-20 DIAGNOSIS — M85851 Other specified disorders of bone density and structure, right thigh: Secondary | ICD-10-CM

## 2018-08-27 ENCOUNTER — Other Ambulatory Visit: Payer: Self-pay

## 2018-08-27 ENCOUNTER — Ambulatory Visit
Admission: RE | Admit: 2018-08-27 | Discharge: 2018-08-27 | Disposition: A | Discharge status: Home / Self Care | Payer: Medicare Other | Source: Ambulatory Visit | Attending: Family Medicine | Admitting: Family Medicine

## 2018-08-27 DIAGNOSIS — M85851 Other specified disorders of bone density and structure, right thigh: Secondary | ICD-10-CM

## 2019-02-21 ENCOUNTER — Other Ambulatory Visit: Payer: Self-pay | Admitting: Family Medicine

## 2019-02-21 DIAGNOSIS — Z1231 Encounter for screening mammogram for malignant neoplasm of breast: Secondary | ICD-10-CM

## 2019-02-23 ENCOUNTER — Other Ambulatory Visit: Payer: Self-pay

## 2019-02-23 ENCOUNTER — Ambulatory Visit
Admission: RE | Admit: 2019-02-23 | Discharge: 2019-02-23 | Disposition: A | Discharge status: Home / Self Care | Payer: Medicare Other | Source: Ambulatory Visit

## 2019-02-23 DIAGNOSIS — Z1231 Encounter for screening mammogram for malignant neoplasm of breast: Secondary | ICD-10-CM

## 2019-04-07 ENCOUNTER — Emergency Department (HOSPITAL_COMMUNITY): Payer: Medicare Other

## 2019-04-07 ENCOUNTER — Encounter (HOSPITAL_COMMUNITY): Payer: Self-pay | Admitting: Emergency Medicine

## 2019-04-07 ENCOUNTER — Emergency Department (HOSPITAL_COMMUNITY)
Admission: EM | Admit: 2019-04-07 | Discharge: 2019-04-07 | Disposition: A | Discharge status: Home / Self Care | Payer: Medicare Other | Attending: Emergency Medicine | Admitting: Emergency Medicine

## 2019-04-07 ENCOUNTER — Other Ambulatory Visit: Payer: Self-pay

## 2019-04-07 ENCOUNTER — Encounter: Payer: Self-pay | Admitting: Cardiology

## 2019-04-07 DIAGNOSIS — R002 Palpitations: Secondary | ICD-10-CM

## 2019-04-07 LAB — TROPONIN I (HIGH SENSITIVITY): Troponin I (High Sensitivity): 3 ng/L (ref <18)

## 2019-04-07 LAB — CBC
HCT: 45.9 % (ref 36.0–46.0)
Hemoglobin: 14.8 g/dL (ref 12.0–15.0)
MCH: 29.8 pg (ref 26.0–34.0)
MCHC: 32.2 g/dL (ref 30.0–36.0)
MCV: 92.5 fL (ref 80.0–100.0)
Platelets: 292 10*3/uL (ref 150–400)
RBC: 4.96 MIL/uL (ref 3.87–5.11)
RDW: 12.3 % (ref 11.5–15.5)
WBC: 8.1 10*3/uL (ref 4.0–10.5)
nRBC: 0 % (ref 0.0–0.2)

## 2019-04-07 LAB — MAGNESIUM: Magnesium: 1.8 mg/dL (ref 1.7–2.4)

## 2019-04-07 LAB — BASIC METABOLIC PANEL
Anion gap: 11 (ref 5–15)
BUN: 12 mg/dL (ref 8–23)
CO2: 24 mmol/L (ref 22–32)
Calcium: 9.4 mg/dL (ref 8.9–10.3)
Chloride: 106 mmol/L (ref 98–111)
Creatinine, Ser: 0.91 mg/dL (ref 0.44–1.00)
GFR calc Af Amer: >60 mL/min (ref >60)
GFR calc non Af Amer: >60 mL/min (ref >60)
Glucose, Bld: 120 mg/dL — ABNORMAL HIGH (ref 70–99)
Potassium: 4.1 mmol/L (ref 3.5–5.1)
Sodium: 141 mmol/L (ref 135–145)

## 2019-04-07 MED ORDER — SODIUM CHLORIDE 0.9% FLUSH: 3.0000 mL | Freq: Once | INTRAVENOUS | Status: DC

## 2019-04-07 MED ORDER — MAGNESIUM SULFATE 2 GM/50ML IV SOLN: 2.0000 g | Freq: Once | INTRAVENOUS | Status: DC

## 2019-04-07 NOTE — ED Provider Notes (Signed)
[facility] EMERGENCY DEPARTMENT Provider Note   CSN: [id number] Arrival date & time: 04/07/19  1540     History Chief Complaint  Patient presents with  . Irregular Heart Beat    [name] is a 74 y.o. female.  HPI Pt is a 74 yo F with a PMH of HTN, GERD and prediabetes presenting to the ED today due to "irregular heart beat." She says that she has been experiencing palpitations intermittently for the past month. They occur at least once an hour and last for approximately 1 second. They are not associated with exertion and often occur at rest. She has experienced fatigue during this time but denies lightheadedness, SOB or chest pain. She was seen by her PCP today who told her to come to the ED for evaluation due to an abnormal EKG. She denies fever, chills, nausea, vomiting, diarrhea.     History reviewed. No pertinent past medical history.  There are no problems to display for this patient.   History reviewed. No pertinent surgical history.   OB History   No obstetric history on file.     No family history on file.  Social History   Tobacco Use  . Smoking status: Not on file  Substance Use Topics  . Alcohol use: Not on file  . Drug use: Not on file    Home Medications Prior to Admission medications   Not on File    Allergies    Patient has no known allergies.  Review of Systems   Review of Systems  Constitutional: Negative for chills and fever.  HENT: Negative for ear pain and sore throat.   Eyes: Negative for pain and visual disturbance.  Respiratory: Negative for cough and shortness of breath.   Cardiovascular: Positive for palpitations. Negative for chest pain and leg swelling.  Gastrointestinal: Negative for abdominal pain, diarrhea, nausea and vomiting.  Genitourinary: Negative for dysuria and hematuria.  Musculoskeletal: Negative for arthralgias and back pain.  Skin: Negative for color change and rash.  Neurological: Negative  for seizures, syncope and light-headedness.  Psychiatric/Behavioral: Negative for agitation.  All other systems reviewed and are negative.   Physical Exam Updated Vital Signs BP (!) 176/92 (BP Location: Right Arm)   Pulse (!) 114   Temp 98.3 F (36.8 C) (Oral)   Resp 18   SpO2 99%   Physical Exam Vitals and nursing note reviewed.  Constitutional:      General: She is not in acute distress.    Appearance: Normal appearance. She is well-developed and normal weight. She is not ill-appearing.  HENT:     Head: Normocephalic and atraumatic.     Right Ear: External ear normal.     Left Ear: External ear normal.     Nose: Nose normal. No congestion or rhinorrhea.     Mouth/Throat:     Mouth: Mucous membranes are moist.     Pharynx: Oropharynx is clear.  Eyes:     Extraocular Movements: Extraocular movements intact.     Pupils: Pupils are equal, round, and reactive to light.  Cardiovascular:     Rate and Rhythm: Regular rhythm. Tachycardia present.     Pulses: Normal pulses.     Heart sounds: Normal heart sounds.  Pulmonary:     Effort: Pulmonary effort is normal. No respiratory distress.     Breath sounds: Normal breath sounds. No stridor. No wheezing, rhonchi or rales.  Abdominal:     General: There is no distension.  Palpations: Abdomen is soft.     Tenderness: There is no abdominal tenderness. There is no guarding or rebound.  Musculoskeletal:        General: Normal range of motion.     Cervical back: Normal range of motion and neck supple. No rigidity.     Right lower leg: No edema.     Left lower leg: No edema.  Skin:    General: Skin is warm and dry.     Capillary Refill: Capillary refill takes less than 2 seconds.  Neurological:     General: No focal deficit present.     Mental Status: She is alert and oriented to person, place, and time. Mental status is at baseline.  Psychiatric:        Mood and Affect: Mood normal.     ED Results / Procedures / Treatments    Labs (all labs ordered are listed, but only abnormal results are displayed) Labs Reviewed  BASIC METABOLIC PANEL  CBC  MAGNESIUM  TROPONIN I (HIGH SENSITIVITY)    EKG EKG Interpretation  Date/Time:  Thursday April 07 2019 16:16:46 EST Ventricular Rate:  107 PR Interval:    QRS Duration: 53 QT Interval:  345 QTC Calculation: 461 R Axis:   -12 Text Interpretation: Sinus tachycardia Abnormal R-wave progression, late transition Inferior infarct, old Lateral leads are also involved No significant change since last tracing Confirmed by Deno Etienne [phone]) on 04/07/2019 4:38:58 PM   Radiology DG Chest 2 View  Result Date: 04/07/2019 CLINICAL DATA:  Irregular heart rate. Shortness of breath on exertion. EXAM: CHEST - 2 VIEW COMPARISON:  None. FINDINGS: Subtle nodular opacity at the LEFT lung apex, probable small chronic pleuroparenchymal scarring. Lungs otherwise clear. No pleural effusion or pneumothorax is seen. Heart size and mediastinal contours are within normal limits. No acute or suspicious osseous finding. IMPRESSION: 1. Small nodular opacity at the LEFT lung apex, suspected chronic benign pleuroparenchymal scarring but neoplastic nodule cannot be excluded and would consider chest CT to ensure benignity. 2. Lungs otherwise clear. No evidence of pneumonia or pulmonary edema. Electronically Signed   By: Franki Cabot M.D.   On: 04/07/2019 16:34    Procedures Procedures (including critical care time)  Medications Ordered in ED Medications - No data to display  ED Course  I have reviewed the triage vital signs and the nursing notes.  Pertinent labs & imaging results that were available during my care of the patient were reviewed by me and considered in my medical decision making (see chart for details).    MDM Rules/Calculators/A&P                     Pt is a 74 yo F with a PMH of HTN, GERD and prediabetes presenting to the ED today due to palpitations. Physical exam  unremarkable. BP 176/92, HR 114, RR 18, SPO2 99%. Afebrile.  On arrival, pt appears generally well and is displaying no signs of acute distress. She was being evaluated by her PCP for palpitations and sent to the ED due to concern on her EKG. Pt has EKG with her which shows NSR and T wave inversions in V1-V6. QTc 381. In triage, EKG obtained which showed QTc in 700's. Pt was subsequently brought back to ED bed and EKG was repeated. Repeat EKG shows QTc 461. On chart review, inverted T waves have been seen on previous EKG's. Collected CBC, BMP, mag and single troponin for further evaluation of pt's palpitations. Pt noted  to have PVC's on monitor.   CBC, BMP, mag WNL. Troponin 3. Tachycardia has improved without intervention. Pt continues to be in NSR on monitor. Unsure of exact etiology of pt's palpitations but potentially related to PVC's. Low concern for metabolic etiology. Low concern for PE as she has no hypoxia, CP, SOB, symptoms concerning for DVT. Discussed results with pt and encourage her to f/u with PCP for potential Holter monitor. Pt expresses understanding and is in agreement with plan. No further w/u or intervention required while in the ED.  Pt stable for discharge. Discussed strict return precautions. Pt stable at time of discharge.   Pt assessed and evaluated with Dr. Tyrone Nine.  Nadeen Landau, MD   Final Clinical Impression(s) / ED Diagnoses Final diagnoses:  Palpitations    Rx / DC Orders ED Discharge Orders    None       Nadeen Landau, MD 04/08/19 [place], [place], DO 04/08/19 [phone]

## 2019-04-07 NOTE — ED Triage Notes (Signed)
Pt sent by her doctor for abnormal EKG. Pt states sometimes she has chest pain/tightness and it feels like her heart is "skipping a beat". Denies known hx of irregular rhythm. Denies shortness of breath.

## 2019-04-25 ENCOUNTER — Encounter: Payer: Self-pay | Admitting: Cardiology

## 2019-04-28 ENCOUNTER — Telehealth: Payer: Self-pay

## 2019-04-28 NOTE — Telephone Encounter (Signed)
NOTES ON FILE FROM [facility] [phone] SENT REFERRAL TO SCHEDULING

## 2019-04-29 ENCOUNTER — Telehealth: Payer: Self-pay

## 2019-04-29 NOTE — Telephone Encounter (Signed)
NOTES ON FILE FROM [facility] [place], SENT REFERRAL TO SCHEDULING

## 2019-05-12 ENCOUNTER — Other Ambulatory Visit: Payer: Self-pay

## 2019-05-12 ENCOUNTER — Encounter: Payer: Self-pay | Admitting: Cardiology

## 2019-05-12 ENCOUNTER — Ambulatory Visit: Payer: Medicare Other | Admitting: Cardiology

## 2019-05-12 ENCOUNTER — Encounter: Payer: Self-pay | Admitting: *Deleted

## 2019-05-12 VITALS — BP 144/70 | HR 66 | Ht 62.0 in | Wt 163.0 lb

## 2019-05-12 DIAGNOSIS — F419 Anxiety disorder, unspecified: Secondary | ICD-10-CM

## 2019-05-12 DIAGNOSIS — R002 Palpitations: Secondary | ICD-10-CM | POA: Diagnosis not present

## 2019-05-12 NOTE — Progress Notes (Signed)
Patient ID: [name], female   DOB: 1945-12-27, 74 y.o.   MRN: [id number] Patient enrolled for [employer] to ship a 14 day ZIO XT long term holter monitor to her home.

## 2019-05-12 NOTE — Progress Notes (Signed)
Cardiology Office Note:    Date:  05/12/2019   ID:  [name], DOB 26-Jul-1945, MRN [id number]  PCP:  Cari Caraway, MD  Cardiologist:  No primary care provider on file.  Electrophysiologist:  None   Referring MD: Cari Caraway, MD     History of Present Illness:    [name] is a 74 y.o. female here for the evaluation of palpitations at the request of Dr. Leonides Schanz.  EKG performed on 04/07/2019 shows sinus rhythm T wave inversions in the anterior precordial leads, possible ischemic changes.  Heart rate was 95 bpm.  Went to the emergency department on 04/07/2019 with palpitations.  Occurred approximately 6 days after getting her second COVID-19 vaccine.  They come and go.  Somewhat sporadic.  Can last a few seconds duration.  Troponin was normal lab work was normal EKG at that time was normal with no evidence of atrial fibrillation.  There was a suggestion for possible Holter monitor.  She has had some history of anxiety.  Currently being treated with metoprolol 50 mg atorvastatin 40 mg.  She states that she did not really have any chest discomfort with any of this.  She continued to have the palpitations since the ER.  She is worried about them.  TSH was performed and was 1.4.  Past Medical History:  Diagnosis Date  . Anxiety   . DM (diabetes mellitus) ([place])   . GERD (gastroesophageal reflux disease)   . Hematuria   . Hemorrhoids   . Hypertension   . Impaired fasting blood sugar   . Mixed hyperlipidemia   . Multinodular goiter   . Obesity   . Osteopenia   . Palpitations   . Vitamin D deficiency     No past surgical history on file.  Current Medications: Current Meds  Medication Sig  . aspirin EC 81 MG tablet Take 81 mg by mouth daily.  Marland Kitchen atorvastatin (LIPITOR) 40 MG tablet Take 40 mg by mouth daily.  . Calcium Carbonate (CALCIUM 600 PO) Take by mouth.  . Cholecalciferol (VITAMIN D-3 PO) Take 50 mg by mouth.  . escitalopram (LEXAPRO) 5 MG tablet Take 5 mg by  mouth daily.  Marland Kitchen lisinopril (ZESTRIL) 5 MG tablet Take 5 mg by mouth daily.  . Metoprolol Succinate 50 MG CS24 Take 50 mg by mouth.  . Multiple Vitamin (MULTIVITAMIN) capsule Take 1 capsule by mouth daily.  Marland Kitchen omeprazole (PRILOSEC) 20 MG capsule Take 20 mg by mouth daily.     Allergies:   Patient has no known allergies.   Social History   Socioeconomic History  . Marital status: Married    Spouse name: Not on file  . Number of children: Not on file  . Years of education: Not on file  . Highest education level: Not on file  Occupational History  . Not on file  Tobacco Use  . Smoking status: Never Smoker  . Smokeless tobacco: Never Used  Substance and Sexual Activity  . Alcohol use: Not on file  . Drug use: Not on file  . Sexual activity: Not on file  Other Topics Concern  . Not on file  Social History Narrative  . Not on file   Social Determinants of Health   Financial Resource Strain:   . Difficulty of Paying Living Expenses:   Food Insecurity:   . Worried About Charity fundraiser in the Last Year:   . Arboriculturist in the Last Year:   Transportation Needs:   .  Lack of Transportation (Medical):   Marland Kitchen Lack of Transportation (Non-Medical):   Physical Activity:   . Days of Exercise per Week:   . Minutes of Exercise per Session:   Stress:   . Feeling of Stress :   Social Connections:   . Frequency of Communication with Friends and Family:   . Frequency of Social Gatherings with Friends and Family:   . Attends Religious Services:   . Active Member of Clubs or Organizations:   . Attends Archivist Meetings:   Marland Kitchen Marital Status:      Family History: The patient's mother and father did have heart disease later in life  ROS:   Please see the history of present illness.    Denies any syncope bleeding orthopnea PND fevers chills.  All other systems reviewed and are negative.  EKGs/Labs/Other Studies Reviewed:    The following studies were reviewed  today: ER notes, lab work, EKG, x-ray reviewed benign  EKG:  EKG is not ordered today.  EKG in the ER on March 11 showed sinus tachycardia nonspecific ST-T wave changes.  Recent Labs: 04/07/2019: BUN 12; Creatinine, Ser 0.91; Hemoglobin 14.8; Magnesium 1.8; Platelets 292; Potassium 4.1; Sodium 141  Recent Lipid Panel No results found for: CHOL, TRIG, HDL, CHOLHDL, VLDL, LDLCALC, LDLDIRECT  Physical Exam:    VS:  BP (!) 144/70   Pulse 66   Ht 5\' 2"  (1.575 m)   Wt 163 lb (73.9 kg)   SpO2 98%   BMI 29.81 kg/m     Wt Readings from Last 3 Encounters:  05/12/19 163 lb (73.9 kg)     GEN:  Well nourished, well developed in no acute distress HEENT: Normal NECK: No JVD; No carotid bruits LYMPHATICS: No lymphadenopathy CARDIAC: RRR, no murmurs, rubs, gallops, 1 ectopic beat noted.  Symptomatic with this. RESPIRATORY:  Clear to auscultation without rales, wheezing or rhonchi  ABDOMEN: Soft, non-tender, non-distended MUSCULOSKELETAL:  No edema; No deformity  SKIN: Warm and dry NEUROLOGIC:  Alert and oriented x 3 PSYCHIATRIC:  Normal affect   ASSESSMENT:    1. Palpitations   2. Anxiety    PLAN:    In order of problems listed above:  Palpitations -Certainly could be PVCs or PACs.  We will go ahead and check a Zio patch monitor to ensure that she does not have any adverse arrhythmia such as atrial fibrillation. -She is already on metoprolol succinate 50 mg a day.  If she is having fairly frequent PVCs, we could always consider increasing metoprolol to 100 mg a day to help suppress.  This would not be unreasonable. -We will check an echocardiogram to ensure proper structure and function of her heart. -Thankfully, she does not have any high risk symptoms such as syncope.  She also stated that she did not have any chest discomfort. -She is a non-smoker nondrinker, no excessive caffeine use no supplements no decongestants.  Anxiety -Agree that this may be playing a role as well.   Recently started escitalopram.   Medication Adjustments/Labs and Tests Ordered: Current medicines are reviewed at length with the patient today.  Concerns regarding medicines are outlined above.  Orders Placed This Encounter  Procedures  . LONG TERM MONITOR (3-14 DAYS)  . ECHOCARDIOGRAM COMPLETE   No orders of the defined types were placed in this encounter.   Patient Instructions  Medication Instructions:  The current medical regimen is effective;  continue present plan and medications.  *If you need a refill on your cardiac  medications before your next appointment, please call your pharmacy*  Testing/Procedures: Your physician has requested that you have an echocardiogram. Echocardiography is a painless test that uses sound waves to create images of your heart. It provides your doctor with information about the size and shape of your heart and how well your heart's chambers and valves are working. This procedure takes approximately one hour. There are no restrictions for this procedure.  ZIO XT- Long Term Monitor Instructions   Your physician has requested you wear your ZIO patch monitor 14 days.   This is a single patch monitor.  [employer] supplies one patch monitor per enrollment.  Additional stickers are not available.   Please do not apply patch if you will be having a Nuclear Stress Test, Echocardiogram, Cardiac CT, MRI, or Chest Xray during the time frame you would be wearing the monitor. The patch cannot be worn during these tests.  You cannot remove and re-apply the ZIO XT patch monitor.   Your ZIO patch monitor will be sent USPS Priority mail from [facility] directly to your home address. The monitor may also be mailed to a PO BOX if home delivery is not available.   It may take 3-5 days to receive your monitor after you have been enrolled.   Once you have received you monitor, please review enclosed instructions.  Your monitor has already been registered assigning  a specific monitor serial # to you.   Applying the monitor   Shave hair from upper left chest.   Hold abrader disc by orange tab.  Rub abrader in 40 strokes over left upper chest as indicated in your monitor instructions.   Clean area with 4 enclosed alcohol pads .  Use all pads to assure are is cleaned thoroughly.  Let dry.   Apply patch as indicated in monitor instructions.  Patch will be place under collarbone on left side of chest with arrow pointing upward.   Rub patch adhesive wings for 2 minutes.Remove white label marked "1".  Remove white label marked "2".  Rub patch adhesive wings for 2 additional minutes.   While looking in a mirror, press and release button in center of patch.  A small green light will flash 3-4 times .  This will be your only indicator the monitor has been turned on.     Do not shower for the first 24 hours.  You may shower after the first 24 hours.   Press button if you feel a symptom. You will hear a small click.  Record Date, Time and Symptom in the Patient Log Book.   When you are ready to remove patch, follow instructions on last 2 pages of Patient Log Book.  Stick patch monitor onto last page of Patient Log Book.   Place Patient Log Book in [place] box.  Use locking tab on box and tape box closed securely.  The [employer] and [employer] has [employer] on it.  Please place in mailbox as soon as possible.  Your physician should have your test results approximately 7 days after the monitor has been mailed back to [facility].   Call [facility] at [phone] if you have questions regarding your ZIO XT patch monitor.  Call them immediately if you see an orange light blinking on your monitor.   If your monitor falls off in less than 4 days contact our Monitor department at [phone].  If your monitor becomes loose or falls off after 4 days call [employer]  at [phone] for suggestions on securing your monitor.   Follow-Up: Follow up  will be based on the results of the above tests.  At [facility], you and your health needs are our priority.  As part of our continuing mission to provide you with exceptional heart care, we have created designated Provider Care Teams.  These Care Teams include your primary Cardiologist (physician) and Advanced Practice Providers (APPs -  Physician Assistants and Nurse Practitioners) who all work together to provide you with the care you need, when you need it.  We recommend signing up for the patient portal called "MyChart".  Sign up information is provided on this After Visit Summary.  MyChart is used to connect with patients for Virtual Visits (Telemedicine).  Patients are able to view lab/test results, encounter notes, upcoming appointments, etc.  Non-urgent messages can be sent to your provider as well.   To learn more about what you can do with MyChart, go to [email or web address].    Thank you for choosing [facility]!!        Signed, Candee Furbish, MD  05/12/2019 11:16 AM    [place]

## 2019-05-12 NOTE — Patient Instructions (Signed)
Medication Instructions:  The current medical regimen is effective;  continue present plan and medications.  *If you need a refill on your cardiac medications before your next appointment, please call your pharmacy*  Testing/Procedures: Your physician has requested that you have an echocardiogram. Echocardiography is a painless test that uses sound waves to create images of your heart. It provides your doctor with information about the size and shape of your heart and how well your heart's chambers and valves are working. This procedure takes approximately one hour. There are no restrictions for this procedure.  ZIO XT- Long Term Monitor Instructions   Your physician has requested you wear your ZIO patch monitor 14 days.   This is a single patch monitor.  [employer] supplies one patch monitor per enrollment.  Additional stickers are not available.   Please do not apply patch if you will be having a Nuclear Stress Test, Echocardiogram, Cardiac CT, MRI, or Chest Xray during the time frame you would be wearing the monitor. The patch cannot be worn during these tests.  You cannot remove and re-apply the ZIO XT patch monitor.   Your ZIO patch monitor will be sent USPS Priority mail from [facility] directly to your home address. The monitor may also be mailed to a PO BOX if home delivery is not available.   It may take 3-5 days to receive your monitor after you have been enrolled.   Once you have received you monitor, please review enclosed instructions.  Your monitor has already been registered assigning a specific monitor serial # to you.   Applying the monitor   Shave hair from upper left chest.   Hold abrader disc by orange tab.  Rub abrader in 40 strokes over left upper chest as indicated in your monitor instructions.   Clean area with 4 enclosed alcohol pads .  Use all pads to assure are is cleaned thoroughly.  Let dry.   Apply patch as indicated in monitor instructions.  Patch  will be place under collarbone on left side of chest with arrow pointing upward.   Rub patch adhesive wings for 2 minutes.Remove white label marked "1".  Remove white label marked "2".  Rub patch adhesive wings for 2 additional minutes.   While looking in a mirror, press and release button in center of patch.  A small green light will flash 3-4 times .  This will be your only indicator the monitor has been turned on.     Do not shower for the first 24 hours.  You may shower after the first 24 hours.   Press button if you feel a symptom. You will hear a small click.  Record Date, Time and Symptom in the Patient Log Book.   When you are ready to remove patch, follow instructions on last 2 pages of Patient Log Book.  Stick patch monitor onto last page of Patient Log Book.   Place Patient Log Book in [place] box.  Use locking tab on box and tape box closed securely.  The [employer] has [employer] on it.  Please place in mailbox as soon as possible.  Your physician should have your test results approximately 7 days after the monitor has been mailed back to [facility].   Call [facility] at [phone] if you have questions regarding your ZIO XT patch monitor.  Call them immediately if you see an orange light blinking on your monitor.   If your monitor falls off in  less than 4 days contact our Monitor department at [phone].  If your monitor becomes loose or falls off after 4 days call [employer] at [phone] for suggestions on securing your monitor.   Follow-Up: Follow up will be based on the results of the above tests.  At [facility], you and your health needs are our priority.  As part of our continuing mission to provide you with exceptional heart care, we have created designated Provider Care Teams.  These Care Teams include your primary Cardiologist (physician) and Advanced Practice Providers (APPs -  Physician Assistants and Nurse Practitioners) who  all work together to provide you with the care you need, when you need it.  We recommend signing up for the patient portal called "MyChart".  Sign up information is provided on this After Visit Summary.  MyChart is used to connect with patients for Virtual Visits (Telemedicine).  Patients are able to view lab/test results, encounter notes, upcoming appointments, etc.  Non-urgent messages can be sent to your provider as well.   To learn more about what you can do with MyChart, go to [email or web address].    Thank you for choosing [facility]!!

## 2019-05-18 ENCOUNTER — Other Ambulatory Visit (INDEPENDENT_AMBULATORY_CARE_PROVIDER_SITE_OTHER): Payer: Medicare Other

## 2019-05-18 DIAGNOSIS — R002 Palpitations: Secondary | ICD-10-CM | POA: Diagnosis not present

## 2019-05-30 ENCOUNTER — Other Ambulatory Visit: Payer: Self-pay

## 2019-05-30 ENCOUNTER — Ambulatory Visit (HOSPITAL_COMMUNITY): Payer: Medicare Other | Attending: Cardiology

## 2019-05-30 DIAGNOSIS — I119 Hypertensive heart disease without heart failure: Secondary | ICD-10-CM | POA: Diagnosis not present

## 2019-05-30 DIAGNOSIS — I517 Cardiomegaly: Secondary | ICD-10-CM | POA: Diagnosis not present

## 2019-05-30 DIAGNOSIS — I080 Rheumatic disorders of both mitral and aortic valves: Secondary | ICD-10-CM | POA: Insufficient documentation

## 2019-05-30 DIAGNOSIS — I358 Other nonrheumatic aortic valve disorders: Secondary | ICD-10-CM | POA: Diagnosis not present

## 2019-05-30 DIAGNOSIS — E669 Obesity, unspecified: Secondary | ICD-10-CM | POA: Diagnosis not present

## 2019-05-30 DIAGNOSIS — E119 Type 2 diabetes mellitus without complications: Secondary | ICD-10-CM | POA: Insufficient documentation

## 2019-05-30 DIAGNOSIS — F419 Anxiety disorder, unspecified: Secondary | ICD-10-CM | POA: Diagnosis not present

## 2019-05-30 DIAGNOSIS — E785 Hyperlipidemia, unspecified: Secondary | ICD-10-CM | POA: Diagnosis not present

## 2019-05-30 DIAGNOSIS — R002 Palpitations: Secondary | ICD-10-CM | POA: Diagnosis not present

## 2020-04-11 ENCOUNTER — Other Ambulatory Visit: Payer: Self-pay | Admitting: Family Medicine

## 2020-04-11 DIAGNOSIS — Z1231 Encounter for screening mammogram for malignant neoplasm of breast: Secondary | ICD-10-CM

## 2020-04-20 ENCOUNTER — Ambulatory Visit
Admission: RE | Admit: 2020-04-20 | Discharge: 2020-04-20 | Disposition: A | Discharge status: Home / Self Care | Payer: Medicare Other | Source: Ambulatory Visit

## 2020-04-20 ENCOUNTER — Other Ambulatory Visit: Payer: Self-pay

## 2020-04-20 DIAGNOSIS — Z1231 Encounter for screening mammogram for malignant neoplasm of breast: Secondary | ICD-10-CM

## 2020-07-02 ENCOUNTER — Other Ambulatory Visit: Payer: Self-pay | Admitting: Family Medicine

## 2020-07-02 DIAGNOSIS — M8580 Other specified disorders of bone density and structure, unspecified site: Secondary | ICD-10-CM

## 2020-12-24 ENCOUNTER — Other Ambulatory Visit: Payer: Self-pay

## 2020-12-24 ENCOUNTER — Ambulatory Visit
Admission: RE | Admit: 2020-12-24 | Discharge: 2020-12-24 | Disposition: A | Discharge status: Home / Self Care | Payer: Medicare Other | Source: Ambulatory Visit | Attending: Family Medicine | Admitting: Family Medicine

## 2020-12-24 DIAGNOSIS — M8580 Other specified disorders of bone density and structure, unspecified site: Secondary | ICD-10-CM

## 2021-01-31 DIAGNOSIS — S6991XA Unspecified injury of right wrist, hand and finger(s), initial encounter: Secondary | ICD-10-CM | POA: Diagnosis not present

## 2021-01-31 DIAGNOSIS — M25531 Pain in right wrist: Secondary | ICD-10-CM | POA: Diagnosis not present

## 2021-01-31 DIAGNOSIS — M1811 Unilateral primary osteoarthritis of first carpometacarpal joint, right hand: Secondary | ICD-10-CM | POA: Diagnosis not present

## 2021-02-14 DIAGNOSIS — M25531 Pain in right wrist: Secondary | ICD-10-CM | POA: Diagnosis not present

## 2021-02-14 DIAGNOSIS — S52511A Displaced fracture of right radial styloid process, initial encounter for closed fracture: Secondary | ICD-10-CM | POA: Diagnosis not present

## 2021-02-25 IMAGING — CR DG CHEST 2V
2 series · 2 of 2 positions shown · non-contrast
Comparison: None.

CLINICAL DATA: Irregular heart rate. Shortness of breath on
exertion.

EXAM:
CHEST - 2 VIEW

[chest pa]
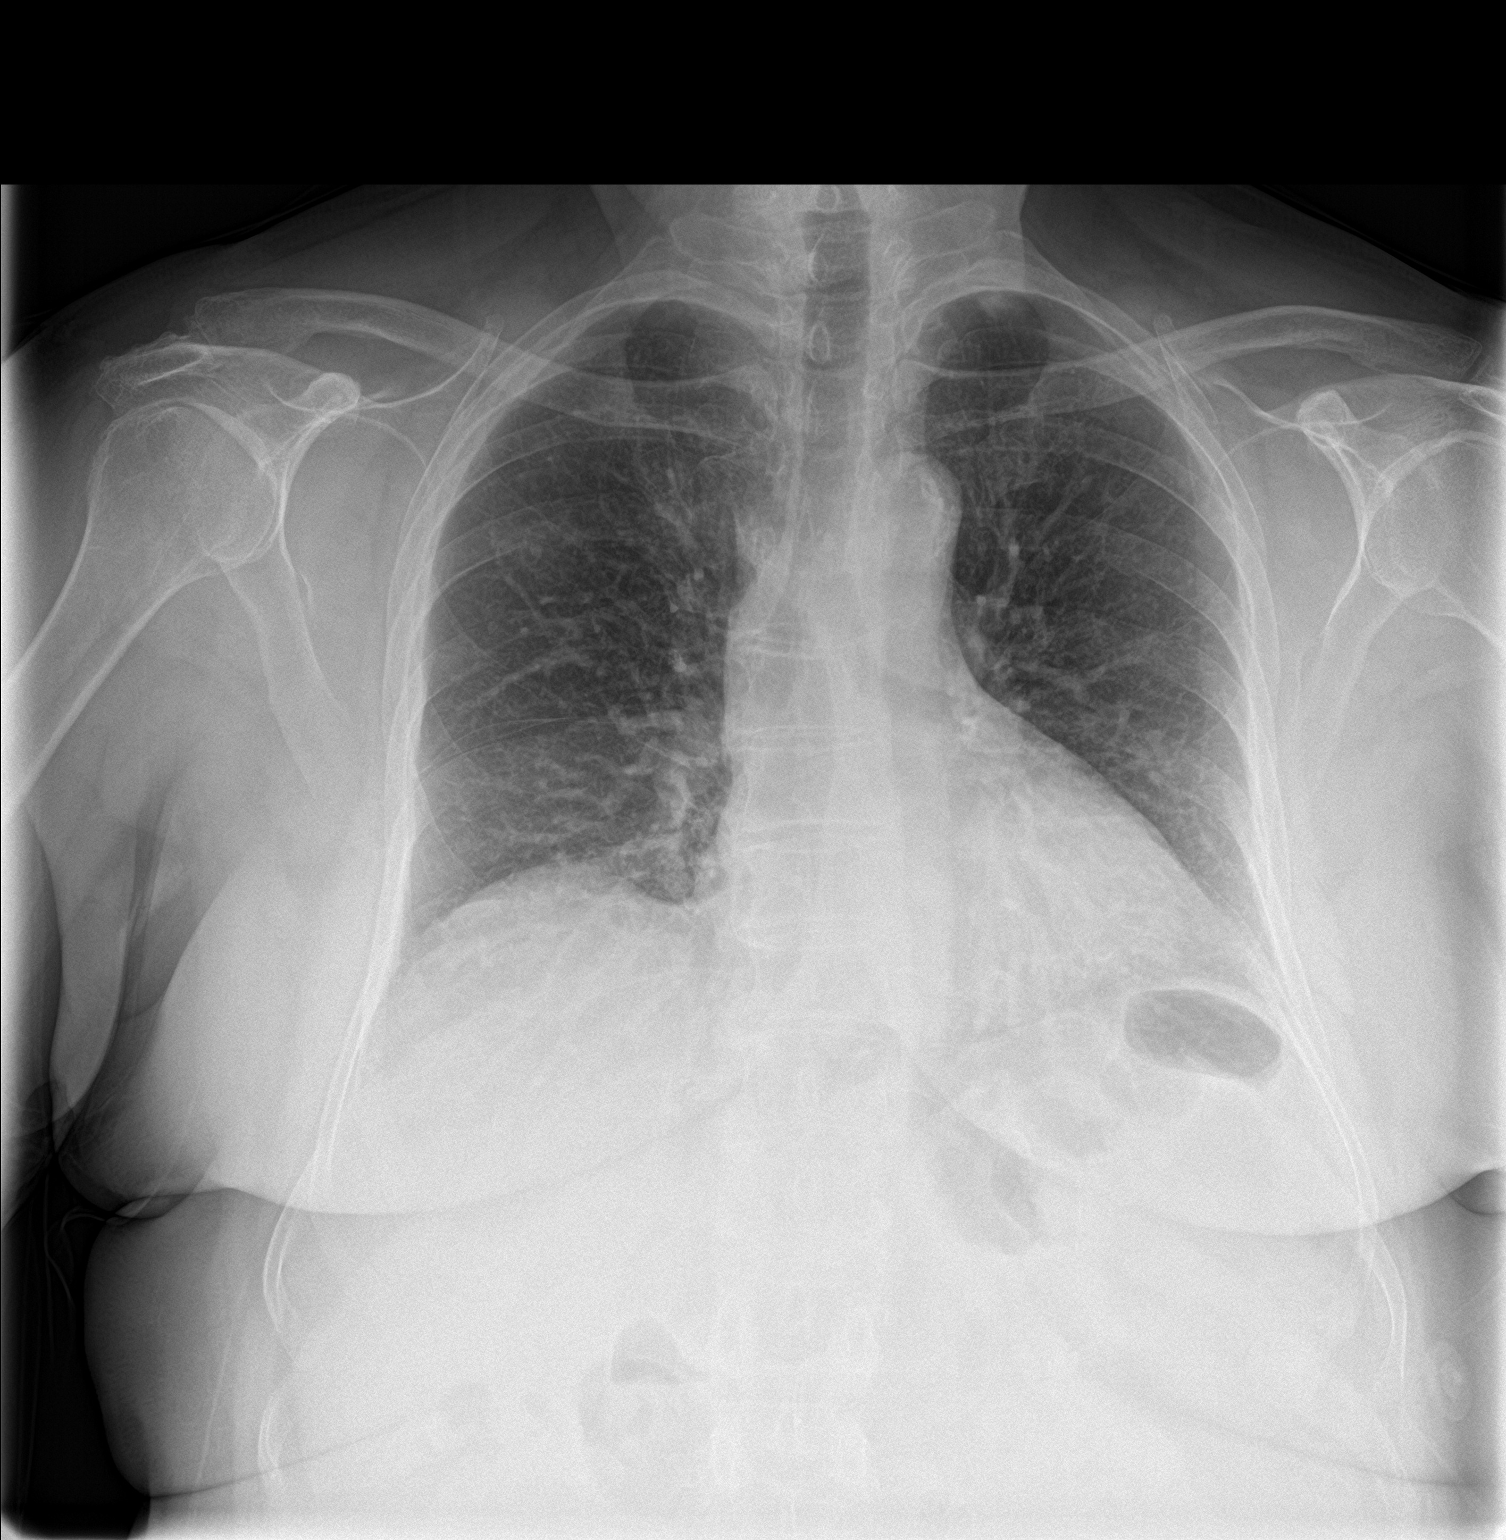

[chest lat]
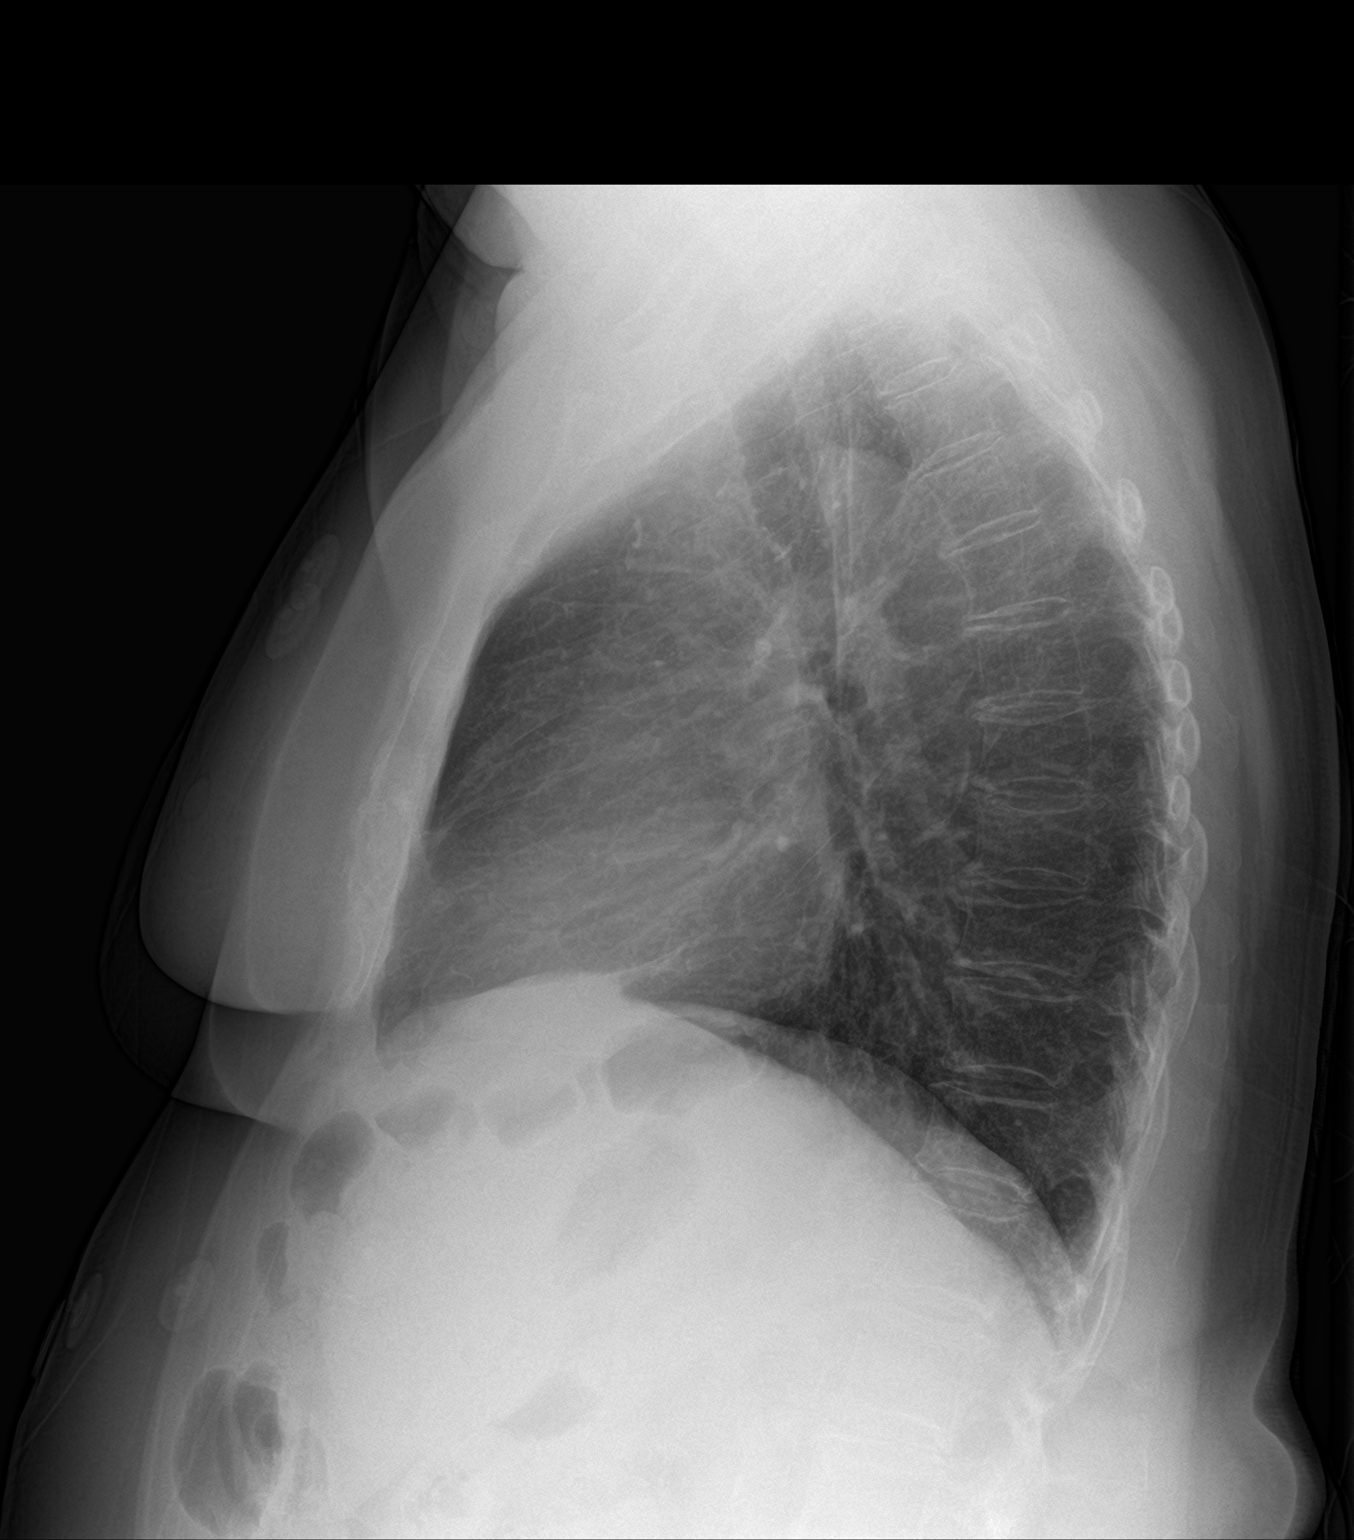

[2 of 2 positions shown; findings below may reference images not displayed]

FINDINGS: Subtle nodular opacity at the LEFT lung apex, probable small chronic
pleuroparenchymal scarring. Lungs otherwise clear. No pleural
effusion or pneumothorax is seen. Heart size and mediastinal
contours are within normal limits. No acute or suspicious osseous
finding.
IMPRESSION: 1. Small nodular opacity at the LEFT lung apex, suspected chronic
benign pleuroparenchymal scarring but neoplastic nodule cannot be
excluded and would consider chest CT to ensure benignity.
2. Lungs otherwise clear. No evidence of pneumonia or pulmonary
edema.

## 2021-03-07 DIAGNOSIS — S52511D Displaced fracture of right radial styloid process, subsequent encounter for closed fracture with routine healing: Secondary | ICD-10-CM | POA: Diagnosis not present

## 2021-03-21 DIAGNOSIS — M85851 Other specified disorders of bone density and structure, right thigh: Secondary | ICD-10-CM | POA: Diagnosis not present

## 2021-03-21 DIAGNOSIS — R7301 Impaired fasting glucose: Secondary | ICD-10-CM | POA: Diagnosis not present

## 2021-03-21 DIAGNOSIS — I10 Essential (primary) hypertension: Secondary | ICD-10-CM | POA: Diagnosis not present

## 2021-03-29 DIAGNOSIS — I10 Essential (primary) hypertension: Secondary | ICD-10-CM | POA: Diagnosis not present

## 2021-03-29 DIAGNOSIS — M810 Age-related osteoporosis without current pathological fracture: Secondary | ICD-10-CM | POA: Diagnosis not present

## 2021-03-29 DIAGNOSIS — E538 Deficiency of other specified B group vitamins: Secondary | ICD-10-CM | POA: Diagnosis not present

## 2021-03-29 DIAGNOSIS — E782 Mixed hyperlipidemia: Secondary | ICD-10-CM | POA: Diagnosis not present

## 2021-03-29 DIAGNOSIS — K219 Gastro-esophageal reflux disease without esophagitis: Secondary | ICD-10-CM | POA: Diagnosis not present

## 2021-04-04 DIAGNOSIS — M25531 Pain in right wrist: Secondary | ICD-10-CM | POA: Diagnosis not present

## 2021-04-19 ENCOUNTER — Other Ambulatory Visit: Payer: Self-pay | Admitting: Family Medicine

## 2021-04-19 DIAGNOSIS — Z1231 Encounter for screening mammogram for malignant neoplasm of breast: Secondary | ICD-10-CM

## 2021-04-23 ENCOUNTER — Ambulatory Visit
Admission: RE | Admit: 2021-04-23 | Discharge: 2021-04-23 | Disposition: A | Discharge status: Home / Self Care | Payer: Medicare Other | Source: Ambulatory Visit

## 2021-04-23 DIAGNOSIS — Z1231 Encounter for screening mammogram for malignant neoplasm of breast: Secondary | ICD-10-CM

## 2021-07-09 DIAGNOSIS — Z0000 Encounter for general adult medical examination without abnormal findings: Secondary | ICD-10-CM | POA: Diagnosis not present

## 2021-11-19 DIAGNOSIS — H5213 Myopia, bilateral: Secondary | ICD-10-CM | POA: Diagnosis not present

## 2021-11-19 DIAGNOSIS — H40003 Preglaucoma, unspecified, bilateral: Secondary | ICD-10-CM | POA: Diagnosis not present

## 2021-11-19 DIAGNOSIS — E119 Type 2 diabetes mellitus without complications: Secondary | ICD-10-CM | POA: Diagnosis not present

## 2021-11-19 DIAGNOSIS — D3132 Benign neoplasm of left choroid: Secondary | ICD-10-CM | POA: Diagnosis not present

## 2021-11-26 DIAGNOSIS — E782 Mixed hyperlipidemia: Secondary | ICD-10-CM | POA: Diagnosis not present

## 2021-11-26 DIAGNOSIS — E538 Deficiency of other specified B group vitamins: Secondary | ICD-10-CM | POA: Diagnosis not present

## 2021-11-26 DIAGNOSIS — R7309 Other abnormal glucose: Secondary | ICD-10-CM | POA: Diagnosis not present

## 2021-11-26 DIAGNOSIS — R7301 Impaired fasting glucose: Secondary | ICD-10-CM | POA: Diagnosis not present

## 2021-11-26 DIAGNOSIS — R300 Dysuria: Secondary | ICD-10-CM | POA: Diagnosis not present

## 2021-11-26 DIAGNOSIS — I10 Essential (primary) hypertension: Secondary | ICD-10-CM | POA: Diagnosis not present

## 2021-11-26 DIAGNOSIS — M810 Age-related osteoporosis without current pathological fracture: Secondary | ICD-10-CM | POA: Diagnosis not present

## 2021-11-26 DIAGNOSIS — Z23 Encounter for immunization: Secondary | ICD-10-CM | POA: Diagnosis not present

## 2021-11-26 DIAGNOSIS — K219 Gastro-esophageal reflux disease without esophagitis: Secondary | ICD-10-CM | POA: Diagnosis not present

## 2022-04-07 ENCOUNTER — Other Ambulatory Visit: Payer: Self-pay | Admitting: Family Medicine

## 2022-04-07 DIAGNOSIS — Z1231 Encounter for screening mammogram for malignant neoplasm of breast: Secondary | ICD-10-CM

## 2022-05-21 DIAGNOSIS — Z1231 Encounter for screening mammogram for malignant neoplasm of breast: Secondary | ICD-10-CM

## 2022-05-23 ENCOUNTER — Other Ambulatory Visit: Payer: Self-pay | Admitting: Family Medicine

## 2022-05-23 DIAGNOSIS — Z1231 Encounter for screening mammogram for malignant neoplasm of breast: Secondary | ICD-10-CM

## 2022-05-28 ENCOUNTER — Ambulatory Visit
Admission: RE | Admit: 2022-05-28 | Discharge: 2022-05-28 | Disposition: A | Discharge status: Home / Self Care | Payer: Medicare Other | Source: Ambulatory Visit

## 2022-05-28 DIAGNOSIS — Z1231 Encounter for screening mammogram for malignant neoplasm of breast: Secondary | ICD-10-CM

## 2022-06-04 DIAGNOSIS — K219 Gastro-esophageal reflux disease without esophagitis: Secondary | ICD-10-CM | POA: Diagnosis not present

## 2022-06-04 DIAGNOSIS — E559 Vitamin D deficiency, unspecified: Secondary | ICD-10-CM | POA: Diagnosis not present

## 2022-06-04 DIAGNOSIS — R7303 Prediabetes: Secondary | ICD-10-CM | POA: Diagnosis not present

## 2022-06-04 DIAGNOSIS — M810 Age-related osteoporosis without current pathological fracture: Secondary | ICD-10-CM | POA: Diagnosis not present

## 2022-06-04 DIAGNOSIS — E782 Mixed hyperlipidemia: Secondary | ICD-10-CM | POA: Diagnosis not present

## 2022-06-04 DIAGNOSIS — I10 Essential (primary) hypertension: Secondary | ICD-10-CM | POA: Diagnosis not present

## 2022-06-04 DIAGNOSIS — E538 Deficiency of other specified B group vitamins: Secondary | ICD-10-CM | POA: Diagnosis not present

## 2022-07-18 DIAGNOSIS — Z0000 Encounter for general adult medical examination without abnormal findings: Secondary | ICD-10-CM | POA: Diagnosis not present

## 2022-12-17 ENCOUNTER — Other Ambulatory Visit: Payer: Self-pay | Admitting: Family Medicine

## 2022-12-17 DIAGNOSIS — E538 Deficiency of other specified B group vitamins: Secondary | ICD-10-CM | POA: Diagnosis not present

## 2022-12-17 DIAGNOSIS — E119 Type 2 diabetes mellitus without complications: Secondary | ICD-10-CM | POA: Diagnosis not present

## 2022-12-17 DIAGNOSIS — K219 Gastro-esophageal reflux disease without esophagitis: Secondary | ICD-10-CM | POA: Diagnosis not present

## 2022-12-17 DIAGNOSIS — M810 Age-related osteoporosis without current pathological fracture: Secondary | ICD-10-CM | POA: Diagnosis not present

## 2022-12-17 DIAGNOSIS — I10 Essential (primary) hypertension: Secondary | ICD-10-CM | POA: Diagnosis not present

## 2022-12-17 DIAGNOSIS — Z79899 Other long term (current) drug therapy: Secondary | ICD-10-CM | POA: Diagnosis not present

## 2022-12-17 DIAGNOSIS — Z23 Encounter for immunization: Secondary | ICD-10-CM | POA: Diagnosis not present

## 2022-12-17 DIAGNOSIS — E559 Vitamin D deficiency, unspecified: Secondary | ICD-10-CM | POA: Diagnosis not present

## 2022-12-17 DIAGNOSIS — E782 Mixed hyperlipidemia: Secondary | ICD-10-CM | POA: Diagnosis not present

## 2023-01-02 DIAGNOSIS — Z79899 Other long term (current) drug therapy: Secondary | ICD-10-CM | POA: Diagnosis not present

## 2023-01-02 DIAGNOSIS — E119 Type 2 diabetes mellitus without complications: Secondary | ICD-10-CM | POA: Diagnosis not present

## 2023-01-02 DIAGNOSIS — D3132 Benign neoplasm of left choroid: Secondary | ICD-10-CM | POA: Diagnosis not present

## 2023-04-07 DIAGNOSIS — E119 Type 2 diabetes mellitus without complications: Secondary | ICD-10-CM | POA: Diagnosis not present

## 2023-04-07 DIAGNOSIS — Z79899 Other long term (current) drug therapy: Secondary | ICD-10-CM | POA: Diagnosis not present

## 2023-06-11 ENCOUNTER — Other Ambulatory Visit: Payer: Self-pay | Admitting: Family Medicine

## 2023-06-11 DIAGNOSIS — Z1231 Encounter for screening mammogram for malignant neoplasm of breast: Secondary | ICD-10-CM

## 2023-06-24 ENCOUNTER — Encounter

## 2023-06-24 DIAGNOSIS — Z1231 Encounter for screening mammogram for malignant neoplasm of breast: Secondary | ICD-10-CM

## 2023-07-03 ENCOUNTER — Encounter

## 2023-07-06 DIAGNOSIS — M810 Age-related osteoporosis without current pathological fracture: Secondary | ICD-10-CM | POA: Diagnosis not present

## 2023-07-06 DIAGNOSIS — E538 Deficiency of other specified B group vitamins: Secondary | ICD-10-CM | POA: Diagnosis not present

## 2023-07-06 DIAGNOSIS — E119 Type 2 diabetes mellitus without complications: Secondary | ICD-10-CM | POA: Diagnosis not present

## 2023-07-06 DIAGNOSIS — K219 Gastro-esophageal reflux disease without esophagitis: Secondary | ICD-10-CM | POA: Diagnosis not present

## 2023-07-06 DIAGNOSIS — I10 Essential (primary) hypertension: Secondary | ICD-10-CM | POA: Diagnosis not present

## 2023-07-06 DIAGNOSIS — E782 Mixed hyperlipidemia: Secondary | ICD-10-CM | POA: Diagnosis not present

## 2023-07-10 ENCOUNTER — Other Ambulatory Visit: Payer: Medicare Other

## 2023-07-21 ENCOUNTER — Ambulatory Visit
Admission: RE | Admit: 2023-07-21 | Discharge: 2023-07-21 | Disposition: A | Discharge status: Home / Self Care | Source: Ambulatory Visit | Attending: Family Medicine | Admitting: Family Medicine

## 2023-07-21 DIAGNOSIS — Z1231 Encounter for screening mammogram for malignant neoplasm of breast: Secondary | ICD-10-CM | POA: Diagnosis not present

## 2023-07-23 ENCOUNTER — Ambulatory Visit (HOSPITAL_BASED_OUTPATIENT_CLINIC_OR_DEPARTMENT_OTHER)
Admission: RE | Admit: 2023-07-23 | Discharge: 2023-07-23 | Disposition: A | Discharge status: Home / Self Care | Source: Ambulatory Visit | Attending: Family Medicine | Admitting: Family Medicine

## 2023-07-23 DIAGNOSIS — Z780 Asymptomatic menopausal state: Secondary | ICD-10-CM | POA: Diagnosis not present

## 2023-07-23 DIAGNOSIS — M810 Age-related osteoporosis without current pathological fracture: Secondary | ICD-10-CM | POA: Diagnosis not present

## 2023-07-23 DIAGNOSIS — M8589 Other specified disorders of bone density and structure, multiple sites: Secondary | ICD-10-CM | POA: Diagnosis not present

## 2023-07-27 DIAGNOSIS — E119 Type 2 diabetes mellitus without complications: Secondary | ICD-10-CM | POA: Diagnosis not present

## 2023-07-27 DIAGNOSIS — I10 Essential (primary) hypertension: Secondary | ICD-10-CM | POA: Diagnosis not present

## 2023-08-25 DIAGNOSIS — E119 Type 2 diabetes mellitus without complications: Secondary | ICD-10-CM | POA: Diagnosis not present

## 2023-08-25 DIAGNOSIS — I10 Essential (primary) hypertension: Secondary | ICD-10-CM | POA: Diagnosis not present

## 2023-08-27 DIAGNOSIS — E119 Type 2 diabetes mellitus without complications: Secondary | ICD-10-CM | POA: Diagnosis not present

## 2023-08-27 DIAGNOSIS — I10 Essential (primary) hypertension: Secondary | ICD-10-CM | POA: Diagnosis not present

## 2023-09-27 DIAGNOSIS — I10 Essential (primary) hypertension: Secondary | ICD-10-CM | POA: Diagnosis not present

## 2023-09-27 DIAGNOSIS — E119 Type 2 diabetes mellitus without complications: Secondary | ICD-10-CM | POA: Diagnosis not present

## 2023-10-26 DIAGNOSIS — E119 Type 2 diabetes mellitus without complications: Secondary | ICD-10-CM | POA: Diagnosis not present

## 2023-10-26 DIAGNOSIS — Z0000 Encounter for general adult medical examination without abnormal findings: Secondary | ICD-10-CM | POA: Diagnosis not present

## 2023-10-26 DIAGNOSIS — E782 Mixed hyperlipidemia: Secondary | ICD-10-CM | POA: Diagnosis not present

## 2023-10-26 DIAGNOSIS — I10 Essential (primary) hypertension: Secondary | ICD-10-CM | POA: Diagnosis not present

## 2023-10-27 DIAGNOSIS — E119 Type 2 diabetes mellitus without complications: Secondary | ICD-10-CM | POA: Diagnosis not present

## 2023-10-27 DIAGNOSIS — I10 Essential (primary) hypertension: Secondary | ICD-10-CM | POA: Diagnosis not present
